# Patient Record
Sex: Female | Born: 1985 | Race: White | Hispanic: Yes | Marital: Married | Smoking: Former smoker
Health system: Southern US, Community
[De-identification: ages and names within clinical notes are randomized; demographics above are authoritative.]

## PROBLEM LIST (undated history)

## (undated) ENCOUNTER — Inpatient Hospital Stay (HOSPITAL_COMMUNITY): Payer: Self-pay

## (undated) DIAGNOSIS — F32A Depression, unspecified: Secondary | ICD-10-CM

## (undated) DIAGNOSIS — Z789 Other specified health status: Secondary | ICD-10-CM

## (undated) DIAGNOSIS — F329 Major depressive disorder, single episode, unspecified: Secondary | ICD-10-CM

## (undated) HISTORY — PX: NO PAST SURGERIES: SHX2092

---
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
## 2009-12-13 ENCOUNTER — Inpatient Hospital Stay (HOSPITAL_COMMUNITY): Admission: AD | Admit: 2009-12-13 | Discharge: 2009-12-14 | Payer: Self-pay | Admitting: Obstetrics and Gynecology

## 2010-02-02 ENCOUNTER — Inpatient Hospital Stay (HOSPITAL_COMMUNITY): Admission: AD | Admit: 2010-02-02 | Discharge: 2010-02-05 | Payer: Self-pay | Admitting: Obstetrics and Gynecology

## 2010-08-16 NOTE — L&D Delivery Note (Signed)
Delivery Note At 8:28 AM a viable female was delivered via Vaginal, Spontaneous Delivery (Presentation: Left Occiput Anterior).   Patient delivered in waterbirth tub.   APGAR: 8, 9; weight 8 lb 1.4 oz (3668 g).   Placenta status: Intact, Spontaneous.  Cord: 3 vessels with the following complications: Cord around neck/shourders, untangled at delivery.    Cord pH: NA  Anesthesia: None  Episiotomy: None Lacerations: 1st degree Suture Repair: no repair required Est. Blood Loss (mL): 400  Skin to skin with mom in tub, then with dad when patient transferring from tub to bed for perineal examination.  Mom to postpartum.  Baby to nursery-stable.  Karyme Mcconathy L 03/17/2011, 9:52 AM

## 2010-08-28 LAB — SYPHILIS: RPR W/REFLEX TO RPR TITER AND TREPONEMAL ANTIBODIES, TRADITIONAL SCREENING AND DIAGNOSIS ALGORITHM: RPR: NONREACTIVE

## 2010-08-28 LAB — TYPE AND SCREEN: Antibody Screen: NEGATIVE

## 2010-08-28 LAB — ABO/RH: RH Type: POSITIVE

## 2010-08-28 LAB — HEPATITIS B SURFACE ANTIGEN: Hepatitis B Surface Ag: NEGATIVE

## 2010-09-28 ENCOUNTER — Inpatient Hospital Stay (HOSPITAL_COMMUNITY)
Admission: AD | Admit: 2010-09-28 | Discharge: 2010-09-28 | Disposition: A | Discharge status: Home / Self Care | Payer: Medicaid Other | Source: Ambulatory Visit | Attending: Obstetrics and Gynecology | Admitting: Obstetrics and Gynecology

## 2010-09-28 DIAGNOSIS — K5900 Constipation, unspecified: Secondary | ICD-10-CM | POA: Insufficient documentation

## 2010-09-28 DIAGNOSIS — R109 Unspecified abdominal pain: Secondary | ICD-10-CM | POA: Insufficient documentation

## 2010-09-28 DIAGNOSIS — O99891 Other specified diseases and conditions complicating pregnancy: Secondary | ICD-10-CM | POA: Insufficient documentation

## 2010-11-01 LAB — CBC
HCT: 40.1 % (ref 36.0–46.0)
Hemoglobin: 13.6 g/dL (ref 12.0–15.0)
MCHC: 33.9 g/dL (ref 30.0–36.0)
RBC: 3.51 MIL/uL — ABNORMAL LOW (ref 3.87–5.11)
RBC: 4.27 MIL/uL (ref 3.87–5.11)
RDW: 14.1 % (ref 11.5–15.5)
RDW: 14.7 % (ref 11.5–15.5)
WBC: 11.7 10*3/uL — ABNORMAL HIGH (ref 4.0–10.5)

## 2010-11-01 LAB — RPR: RPR Ser Ql: NONREACTIVE

## 2010-11-03 LAB — URINALYSIS, ROUTINE W REFLEX MICROSCOPIC
Bilirubin Urine: NEGATIVE
Ketones, ur: NEGATIVE mg/dL
Protein, ur: NEGATIVE mg/dL
Specific Gravity, Urine: 1.02 (ref 1.005–1.030)
Urobilinogen, UA: 0.2 mg/dL (ref 0.0–1.0)
pH: 6.5 (ref 5.0–8.0)

## 2010-11-03 LAB — WET PREP, GENITAL
Trich, Wet Prep: NONE SEEN
Yeast Wet Prep HPF POC: NONE SEEN

## 2010-11-03 LAB — GC/CHLAMYDIA PROBE AMP, GENITAL
Chlamydia, DNA Probe: NEGATIVE
GC Probe Amp, Genital: NEGATIVE

## 2011-03-17 ENCOUNTER — Encounter (HOSPITAL_COMMUNITY): Payer: Self-pay | Admitting: *Deleted

## 2011-03-17 ENCOUNTER — Inpatient Hospital Stay (HOSPITAL_COMMUNITY)
Admission: AD | Admit: 2011-03-17 | Discharge: 2011-03-17 | Disposition: A | Discharge status: Home / Self Care | Payer: Medicaid Other | Source: Ambulatory Visit | Attending: Obstetrics and Gynecology | Admitting: Obstetrics and Gynecology

## 2011-03-17 ENCOUNTER — Inpatient Hospital Stay (HOSPITAL_COMMUNITY)
Admission: AD | Admit: 2011-03-17 | Discharge: 2011-03-19 | DRG: 775 | Disposition: A | Discharge status: Home / Self Care | Payer: Medicaid Other | Source: Ambulatory Visit | Attending: Obstetrics and Gynecology | Admitting: Obstetrics and Gynecology

## 2011-03-17 DIAGNOSIS — Z3490 Encounter for supervision of normal pregnancy, unspecified, unspecified trimester: Secondary | ICD-10-CM

## 2011-03-17 DIAGNOSIS — O479 False labor, unspecified: Secondary | ICD-10-CM | POA: Insufficient documentation

## 2011-03-17 HISTORY — DX: Other specified health status: Z78.9

## 2011-03-17 MED ORDER — ONDANSETRON HCL 4 MG/2ML IJ SOLN: 4.0000 mg | INTRAMUSCULAR | Status: DC | PRN

## 2011-03-17 MED ORDER — PRENATAL PLUS 27-1 MG PO TABS
1.0000 | ORAL_TABLET | Freq: Every day | ORAL | Status: DC
  Administered 2011-03-18 – 2011-03-19 (×2): 1 via ORAL
  Filled 2011-03-17 (×2): qty 1

## 2011-03-17 MED ORDER — MAGNESIUM HYDROXIDE 400 MG/5ML PO SUSP: 30.0000 mL | ORAL | Status: DC | PRN

## 2011-03-17 MED ORDER — FLEET ENEMA 7-19 GM/118ML RE ENEM: 1.0000 | ENEMA | RECTAL | Status: DC | PRN

## 2011-03-17 MED ORDER — IBUPROFEN 600 MG PO TABS: 600.0000 mg | ORAL_TABLET | Freq: Four times a day (QID) | ORAL | Status: DC | PRN

## 2011-03-17 MED ORDER — BENZOCAINE-MENTHOL 20-0.5 % EX AERO: 1.0000 "application " | INHALATION_SPRAY | CUTANEOUS | Status: DC | PRN

## 2011-03-17 MED ORDER — OXYCODONE-ACETAMINOPHEN 5-325 MG PO TABS
2.0000 | ORAL_TABLET | ORAL | Status: DC | PRN
  Administered 2011-03-17: 2 via ORAL

## 2011-03-17 MED ORDER — IBUPROFEN 600 MG PO TABS
600.0000 mg | ORAL_TABLET | Freq: Four times a day (QID) | ORAL | Status: DC
  Administered 2011-03-17 – 2011-03-19 (×7): 600 mg via ORAL
  Filled 2011-03-17 (×7): qty 1

## 2011-03-17 MED ORDER — LIDOCAINE HCL (PF) 1 % IJ SOLN: 30.0000 mL | INTRAMUSCULAR | Status: DC | PRN

## 2011-03-17 MED ORDER — LANOLIN HYDROUS EX OINT: TOPICAL_OINTMENT | CUTANEOUS | Status: DC | PRN

## 2011-03-17 MED ORDER — ONDANSETRON HCL 4 MG/2ML IJ SOLN: 4.0000 mg | Freq: Four times a day (QID) | INTRAMUSCULAR | Status: DC | PRN

## 2011-03-17 MED ORDER — ZOLPIDEM TARTRATE 5 MG PO TABS: 5.0000 mg | ORAL_TABLET | Freq: Every evening | ORAL | Status: DC | PRN

## 2011-03-17 MED ORDER — SIMETHICONE 80 MG PO CHEW: 80.0000 mg | CHEWABLE_TABLET | ORAL | Status: DC | PRN

## 2011-03-17 MED ORDER — ACETAMINOPHEN 325 MG PO TABS: 650.0000 mg | ORAL_TABLET | ORAL | Status: DC | PRN

## 2011-03-17 MED ORDER — LACTATED RINGERS IV SOLN: INTRAVENOUS | Status: DC

## 2011-03-17 MED ORDER — LACTATED RINGERS IV SOLN: 500.0000 mL | INTRAVENOUS | Status: DC | PRN

## 2011-03-17 MED ORDER — HYDROXYZINE PAMOATE 50 MG PO CAPS
50.0000 mg | ORAL_CAPSULE | Freq: Once | ORAL | Status: AC
  Administered 2011-03-17: 50 mg via ORAL
  Filled 2011-03-17: qty 1

## 2011-03-17 MED ORDER — MEASLES, MUMPS & RUBELLA VAC ~~LOC~~ INJ: 0.5000 mL | INJECTION | Freq: Once | SUBCUTANEOUS | Status: DC

## 2011-03-17 MED ORDER — SENNOSIDES-DOCUSATE SODIUM 8.6-50 MG PO TABS
1.0000 | ORAL_TABLET | Freq: Every day | ORAL | Status: DC
  Administered 2011-03-17: 1 via ORAL
  Administered 2011-03-18: 2 via ORAL

## 2011-03-17 MED ORDER — CITRIC ACID-SODIUM CITRATE 334-500 MG/5ML PO SOLN: 30.0000 mL | ORAL | Status: DC | PRN

## 2011-03-17 MED ORDER — ONDANSETRON HCL 4 MG PO TABS: 4.0000 mg | ORAL_TABLET | ORAL | Status: DC | PRN

## 2011-03-17 MED ORDER — TETANUS-DIPHTH-ACELL PERTUSSIS 5-2.5-18.5 LF-MCG/0.5 IM SUSP
0.5000 mL | Freq: Once | INTRAMUSCULAR | Status: AC
  Administered 2011-03-18: 0.5 mL via INTRAMUSCULAR

## 2011-03-17 MED ORDER — DIPHENHYDRAMINE HCL 25 MG PO CAPS: 25.0000 mg | ORAL_CAPSULE | Freq: Four times a day (QID) | ORAL | Status: DC | PRN

## 2011-03-17 MED ORDER — OXYTOCIN 10 UNIT/ML IJ SOLN
10.0000 [IU] | Freq: Once | INTRAMUSCULAR | Status: DC
  Administered 2011-03-17: 20 [IU] via INTRAMUSCULAR

## 2011-03-17 MED ORDER — IBUPROFEN 600 MG PO TABS
600.0000 mg | ORAL_TABLET | Freq: Four times a day (QID) | ORAL | Status: DC | PRN
  Administered 2011-03-17: 600 mg via ORAL

## 2011-03-17 MED ORDER — OXYCODONE-ACETAMINOPHEN 5-325 MG PO TABS: 2.0000 | ORAL_TABLET | ORAL | Status: DC | PRN

## 2011-03-17 MED ORDER — WITCH HAZEL-GLYCERIN EX PADS: MEDICATED_PAD | CUTANEOUS | Status: DC | PRN

## 2011-03-17 MED ORDER — OXYTOCIN 20 UNITS IN LACTATED RINGERS INFUSION - SIMPLE: 125.0000 mL/h | Freq: Once | INTRAVENOUS | Status: DC

## 2011-03-17 MED ORDER — OXYCODONE-ACETAMINOPHEN 5-325 MG PO TABS
1.0000 | ORAL_TABLET | ORAL | Status: DC | PRN
  Administered 2011-03-17 (×3): 1 via ORAL
  Filled 2011-03-17 (×2): qty 1

## 2011-03-17 MED ORDER — OXYTOCIN 20 UNITS IN LACTATED RINGERS INFUSION - SIMPLE: 125.0000 mL/h | INTRAVENOUS | Status: DC | PRN

## 2011-03-17 NOTE — Progress Notes (Signed)
Patient ready for transfer. VSS.  Bleeding mininal. Infant has been skin-to-skin and has breastfed.  Nigel Bridgeman, CNM

## 2011-03-17 NOTE — Progress Notes (Signed)
Feeling some pressure, in tub at 7:48a.  Husband and female support person at bedside.    Objective: VS stable. FHR reassuring by auscultation.    UC:   regular, every 4 minutes SVE:   Dilation: 10 Effacement (%): 100 Station: +1 Exam by:: Nyasiah Moffet, CNM  Labs: Lab Results  Component Value Date   WBC 16.0* 02/04/2010   HGB 11.4* 02/04/2010   HCT 33.5* 02/04/2010   MCV 95.3 02/04/2010   PLT 146* 02/04/2010    Assessment / Plan: Spontaneous labor, progressing normally Initiate active pushing in tub.  Anticipated MOD:  NSVD  Bridgett Hattabaugh L 03/17/2011, 9:55 AM

## 2011-03-17 NOTE — Plan of Care (Signed)
Pt desires to go home,  FHR reactive Cat 1  Will D/C home with 50mg  Vistaril PO

## 2011-03-17 NOTE — Progress Notes (Signed)
Pt  Reports ROM at 0430, contractions worsening

## 2011-03-17 NOTE — H&P (Signed)
Danielle Cherry is a 25 y.o. female presenting for increased contractions and ROM, clear fluid.  Seen in [facility]U earlier tonight without cervical change, elected to go home. Maternal Medical History:  Reason for admission: Reason for admission: rupture of membranes and contractions.  Contractions: Onset was less than 1 hour ago.   Frequency: regular.   Duration is approximately 60 seconds.   Perceived severity is moderate.    Fetal activity: Perceived fetal activity is normal.   Last perceived fetal movement was within the past hour.      OB History    Grav Para Term Preterm Abortions TAB SAB Ect Mult Living   2 1 1       1      Past Medical History  Diagnosis Date  . No pertinent past medical history   Pregnancy remarkable for: 1. Hx ovarian cysts 2.  Short stature 3. Closely -spaced pregnancy 4. GBS negative 5.  Plans waterbirth   History reviewed. No pertinent past surgical history.  History of ovarian cysts, anemia in past. Family History: FH cardiac disease, adult onset diabetes Social History:  reports that she has never smoked. She has never used smokeless tobacco. She reports that she drinks about .6 ounces of alcohol per week. She reports that she does not use illicit drugs.  FOB involved, supportive,   Patient unemployed, FOB in [employer].  No etoh during pregnancy.    Review of Systems  Constitutional: Negative.   HENT: Negative.   Eyes: Negative.   Respiratory: Negative.   Gastrointestinal: Negative.   Genitourinary: Negative.   Musculoskeletal: Negative.   Skin: Negative.   Neurological: Negative.   Endo/Heme/Allergies: Negative.   Psychiatric/Behavioral: Negative.     Cervix on admission 5 cm, with leaking of clear fluid, vtx, -1, 100% effaced. FHR reassuring on initial tracing. Blood pressure 115/64, pulse 100, temperature 98.2 F (36.8 C), temperature source Oral, resp. rate 18, height 4\' 11"  (1.499 m), weight 82.101 kg (181 lb), last menstrual period  06/19/2010. Maternal Exam:  Uterine Assessment: Contraction strength is moderate.  Contraction duration is 60 seconds. Contraction frequency is regular.   Abdomen: Patient reports no abdominal tenderness. Fundal height is 39 weeks.   Estimated fetal weight is 8 lbs.   Fetal presentation: vertex  Introitus: Normal vulva. Normal vagina.  Vaginal discharge: clear fluid.  Ferning test: not done.  Nitrazine test: not done. Amniotic fluid character: clear.  Pelvis: adequate for delivery.      Physical Exam  Constitutional: She appears well-developed and well-nourished.  HENT:  Head: Normocephalic.  Eyes: Pupils are equal, round, and reactive to light.  Neck: Normal range of motion.  Cardiovascular: Normal rate.   Respiratory: Effort normal.  GI: Soft.  Genitourinary: Vagina normal. Vaginal discharge: clear fluid.  Musculoskeletal: Normal range of motion.  Neurological: She is alert.  Skin: Skin is warm and dry.  Psychiatric: She has a normal mood and affect.    Prenatal labs: ABO, Rh:  A+ Antibody: Negative (01/13 0000) Rubella:  Immune RPR: Nonreactive (01/13 0000)  HBsAg: Negative (01/13 0000)  HIV: Non-reactive (01/13 0000)  GBS: Negative (07/02 0000)   Assessment/Plan: IUP at 39 2/7 weeks SROM, active labor  GBS negative Desires waterbirth.  Plan: Admit to [facility] Routine CNM orders Waterbirth care Intermittent monitoring.  Danielle Cherry L 03/17/2011, 9:23 AM

## 2011-03-17 NOTE — Progress Notes (Signed)
Pt presents for a labor check-u/c's starteed at 2300

## 2011-03-17 NOTE — Progress Notes (Signed)
Pt continues to be in the tub for delivery.

## 2011-03-17 NOTE — ED Provider Notes (Signed)
Danielle Cherry is a 25 y.o. female presenting for on set of ctx around 2300, she says they come and go sometimes for 20 minutes then come back and get regular again. No VB, LOF, +FM.  Maternal Medical History:  Reason for admission: Reason for admission: contractions.  Contractions: Onset was 3-5 hours ago.   Frequency: irregular.   Perceived severity is mild.    Fetal activity: Perceived fetal activity is normal.   Last perceived fetal movement was within the past hour.    Prenatal complications: no prenatal complications   OB History    Grav Para Term Preterm Abortions TAB SAB Ect Mult Living   2 1 1       1      Past Medical History  Diagnosis Date  . No pertinent past medical history    No past surgical history on file. Family History: family history is not on file. Social History:  reports that she has never smoked. She has never used smokeless tobacco. She reports that she drinks about .6 ounces of alcohol per week. She reports that she does not use illicit drugs.  Review of Systems  All other systems reviewed and are negative.    Dilation: 4 Effacement (%): 100 Station: -1 Exam by:: S.Jamoni Hewes,CNM Blood pressure 125/68, pulse 87, temperature 97.7 F (36.5 C), temperature source Oral, resp. rate 20, height 4\' 11"  (1.499 m), weight 82.101 kg (181 lb), last menstrual period 06/19/2010. Maternal Exam:  Uterine Assessment: Contraction strength is mild.  Contraction frequency is regular.   Abdomen: Patient reports no abdominal tenderness. Estimated fetal weight is 7#8oz.   Fetal presentation: vertex  Introitus: Normal vulva. Normal vagina.    Fetal Exam Fetal Monitor Review: Mode: ultrasound.   Baseline rate: 140.  Variability: moderate (6-25 bpm).   Pattern: accelerations present and no decelerations.    Fetal State Assessment: Category I - tracings are normal.     Physical Exam  Nursing note and vitals reviewed. Constitutional: She is oriented to person, place,  and time. She appears well-developed and well-nourished.  Cardiovascular: Normal rate and regular rhythm.   Respiratory: Effort normal and breath sounds normal.  GI: Soft.  Genitourinary: Vagina normal and uterus normal.  Neurological: She is alert and oriented to person, place, and time.  Skin: Skin is warm and dry.    Prenatal labs: ABO, Rh:   Antibody:   Rubella:   RPR:    HBsAg:    HIV:    GBS:     Assessment/Plan: G2P1 at 38.3 wks,  May be in early labor No cervical change from office visit GBS negative FHR reassuring Pt desires to go home and with something for sleep   Shaena Parkerson M 03/17/2011, 2:04 AM

## 2011-03-18 ENCOUNTER — Encounter (HOSPITAL_COMMUNITY): Payer: Self-pay | Admitting: Obstetrics and Gynecology

## 2011-03-18 LAB — CBC
HCT: 34.3 % — ABNORMAL LOW (ref 36.0–46.0)
Hemoglobin: 10.9 g/dL — ABNORMAL LOW (ref 12.0–15.0)
RBC: 3.88 MIL/uL (ref 3.87–5.11)
WBC: 13.9 10*3/uL — ABNORMAL HIGH (ref 4.0–10.5)

## 2011-03-18 NOTE — Progress Notes (Addendum)
Post Partum Day 1 Subjective: no complaints and up ad lib.  Breastfeeding going well.  Undecided regarding contraception.  Using Motrin for pain.  Objective: Blood pressure 114/77, pulse 93, temperature 98.2 F (36.8 C), temperature source Oral, resp. rate 18, height 4\' 11"  (1.499 m), weight 82.101 kg (181 lb), last menstrual period 06/19/2010.  Physical Exam:  General: alert Lochia: appropriate Uterine Fundus: firm Incision: No lacerations DVT Evaluation: No evidence of DVT seen on physical exam.   Basename 03/18/11 0520  HGB 10.9*  HCT 34.3*    Assessment/Plan: Plan for discharge tomorrow Continue current care.    LOS: 1 day   LATHAM,VICKI L 03/18/2011, 12:12 PM    Agree with above Purcell Nails, MD

## 2011-03-18 NOTE — Progress Notes (Signed)
Mom states switched to BO/Formula.  Said she was bleeding and baby was spitting up mucus.  Explained that bleeding was d/t latch and offered assistance.  Visitors in room.  Mom said she will call for assistance.  Educated on benefits of BF v/s BO and explained BF infants spit up less than Formula infants.  Educated on early feeding cues.  Mom will call for assistance

## 2011-03-18 NOTE — Progress Notes (Signed)
UR Chart review completed.  

## 2011-03-19 MED ORDER — IBUPROFEN 600 MG PO TABS: 600.0000 mg | ORAL_TABLET | Freq: Four times a day (QID) | ORAL | Status: AC

## 2011-03-19 NOTE — Progress Notes (Signed)
Post Partum Day 2 Subjective: no complaints.  Ready for discharge.  Breastfeeding.  Plans Nexplanon.  Objective: Blood pressure 114/73, pulse 78, temperature 98 F (36.7 C), temperature source Oral, resp. rate 18, height 4\' 11"  (1.499 m), weight 82.101 kg (181 lb), last menstrual period 06/19/2010, SpO2 99.00%, unknown if currently breastfeeding.  Physical Exam:  General: alert Lochia: appropriate Uterine Fundus: firm Incision: None DVT Evaluation: No evidence of DVT seen on physical exam.   Basename 03/18/11 0520  HGB 10.9*  HCT 34.3*    Assessment/Plan: Discharge home Breastfeeding Rx Motrin Plan follow-up in 5 weeks for pp exam, then at 6 weeks for Nexplanon.   LOS: 2 days   Orval Dortch L 03/19/2011, 10:53 AM

## 2011-03-19 NOTE — Discharge Summary (Deleted)
Obstetric Discharge Summary Reason for Admission: onset of labor Prenatal Procedures: ultrasound Intrapartum Procedures: spontaneous vaginal delivery, waterbirth Postpartum Procedures: none Complications-Operative and Postpartum: none Hemoglobin  Date Value Range Status  03/18/2011 10.9* 12.0-15.0 (g/dL) Final     HCT  Date Value Range Status  03/18/2011 34.3* 36.0-46.0 (%) Final    Discharge Diagnoses: Term Pregnancy-delivered  Discharge Information: Date: 03/19/2011 Activity: unrestricted Diet: routine Medications: Ibuprophen Condition: stable Instructions: refer to practice specific booklet Discharge to: home Contraception:  Plans Nexplanon Follow-Up:  5 weeks at [facility]   Newborn Data: Live born female  Birth Weight: 8 lb 1.4 oz (3668 g) APGAR: 8, 9  Home with mother.  Tennis Mckinnon L 03/19/2011, 10:49 AM

## 2011-03-19 NOTE — Discharge Summary (Signed)
Obstetric Discharge Summary Reason for Admission: onset of labor Prenatal Procedures: ultrasound Intrapartum Procedures: spontaneous vaginal delivery in birthtub Postpartum Procedures: none Complications-Operative and Postpartum: none Hemoglobin  Date Value Range Status  03/18/2011 10.9* 12.0-15.0 (g/dL) Final     HCT  Date Value Range Status  03/18/2011 34.3* 36.0-46.0 (%) Final    Discharge Diagnoses: Term Pregnancy-delivered  Discharge Information: Date: 03/19/2011 Activity: Per booklet Diet: routine Medications: Ibuprophen Condition: stable Instructions: refer to practice specific booklet Discharge to: home   Newborn Data: Live born female  Birth Weight: 8 lb 1.4 oz (3668 g) APGAR: 8, 9  Home with mother.  Tawona Filsinger L 03/19/2011, 10:56 AM

## 2011-03-26 NOTE — H&P (Signed)
NAME:  Danielle Cherry, Danielle Cherry                      ACCOUNT NO.:  MEDICAL RECORD NO.:  [id number]  LOCATION:                                 FACILITY:  PHYSICIAN:  Brynlei Klausner L. Gyasi Hazzard, C.N.M.DATE OF BIRTH:  09/25/1985  DATE OF ADMISSION: DATE OF DISCHARGE:                             HISTORY & PHYSICAL   DIAGNOSES: 1. Intrauterine pregnancy at term. 2. Desires water birth.  POSTDELIVERY DIAGNOSIS:  Spontaneous vaginal delivery at term.  First degree laceration.  PROCEDURES: 1. Normal spontaneous vaginal birth. 2. First degree laceration, no repair required.  ANESTHESIA AND ANALGESIA:  None.  FINDINGS:  The patient progressed quickly to completely dilated and pushed approximately 15 minutes with delivery of a viable female by the name of Genivieve at 8:28 a.m. on March 17, 2011.  Apgars were 8 and 9. Infant weight was 8 pounds, 1.4 ounces.  Infant was placed on mom skin to skin.  The patient was removed from the tub prior to delivery of placenta.  There was a loose nuchal cord noted at the time of delivery. This was reduced over the vertex, at the time of delivery.  Estimated blood loss was approximately 400 mL.  First degree perineal laceration was noted but did not require repair.  The patient was planning to breast feed.  The infant remained with mother but was appropriate for the stable term nursery.  The patient in recovery phase in good condition.     Renaldo Reel Emilee Hero, C.N.M.     VLL/MEDQ  D:  03/25/2011  T:  03/25/2011  Job:  [id number]

## 2011-04-12 ENCOUNTER — Emergency Department (HOSPITAL_COMMUNITY): Payer: Medicaid Other

## 2011-04-12 ENCOUNTER — Emergency Department (HOSPITAL_COMMUNITY)
Admission: EM | Admit: 2011-04-12 | Discharge: 2011-04-12 | Disposition: A | Discharge status: Home / Self Care | Payer: Medicaid Other | Attending: Emergency Medicine | Admitting: Emergency Medicine

## 2011-04-12 DIAGNOSIS — M549 Dorsalgia, unspecified: Secondary | ICD-10-CM | POA: Insufficient documentation

## 2011-04-12 DIAGNOSIS — M25559 Pain in unspecified hip: Secondary | ICD-10-CM | POA: Insufficient documentation

## 2011-04-12 DIAGNOSIS — R509 Fever, unspecified: Secondary | ICD-10-CM | POA: Insufficient documentation

## 2011-04-12 DIAGNOSIS — N949 Unspecified condition associated with female genital organs and menstrual cycle: Secondary | ICD-10-CM | POA: Insufficient documentation

## 2011-04-12 DIAGNOSIS — R1032 Left lower quadrant pain: Secondary | ICD-10-CM | POA: Insufficient documentation

## 2011-04-12 DIAGNOSIS — N809 Endometriosis, unspecified: Secondary | ICD-10-CM | POA: Insufficient documentation

## 2011-04-12 LAB — URINALYSIS, ROUTINE W REFLEX MICROSCOPIC
Glucose, UA: NEGATIVE mg/dL
Leukocytes, UA: NEGATIVE
Nitrite: NEGATIVE
Specific Gravity, Urine: 1.025 (ref 1.005–1.030)
pH: 6 (ref 5.0–8.0)

## 2011-04-12 LAB — DIFFERENTIAL
Basophils Absolute: 0 10*3/uL (ref 0.0–0.1)
Lymphocytes Relative: 38 % (ref 12–46)
Lymphs Abs: 1.8 10*3/uL (ref 0.7–4.0)
Neutrophils Relative %: 50 % (ref 43–77)

## 2011-04-12 LAB — BASIC METABOLIC PANEL
BUN: 11 mg/dL (ref 6–23)
Chloride: 104 mEq/L (ref 96–112)
Creatinine, Ser: 0.82 mg/dL (ref 0.50–1.10)
GFR calc Af Amer: >60 mL/min (ref >60)
GFR calc non Af Amer: >60 mL/min (ref >60)
Potassium: 3.7 mEq/L (ref 3.5–5.1)

## 2011-04-12 LAB — CBC
HCT: 44.4 % (ref 36.0–46.0)
MCV: 86.2 fL (ref 78.0–100.0)
Platelets: 195 10*3/uL (ref 150–400)
RBC: 5.15 MIL/uL — ABNORMAL HIGH (ref 3.87–5.11)
WBC: 4.7 10*3/uL (ref 4.0–10.5)

## 2011-04-12 LAB — WET PREP, GENITAL: Trich, Wet Prep: NONE SEEN

## 2011-04-12 IMAGING — CR DG HIP (WITH OR WITHOUT PELVIS) 2-3V*L*
3 series · 3 of 3 positions shown · non-contrast
Comparison: None.

CLINICAL DATA: Left hip pain for many years worse since childbirth
1 month ago.

LEFT HIP - COMPLETE 2+ VIEW

[t pelvis a.p.]
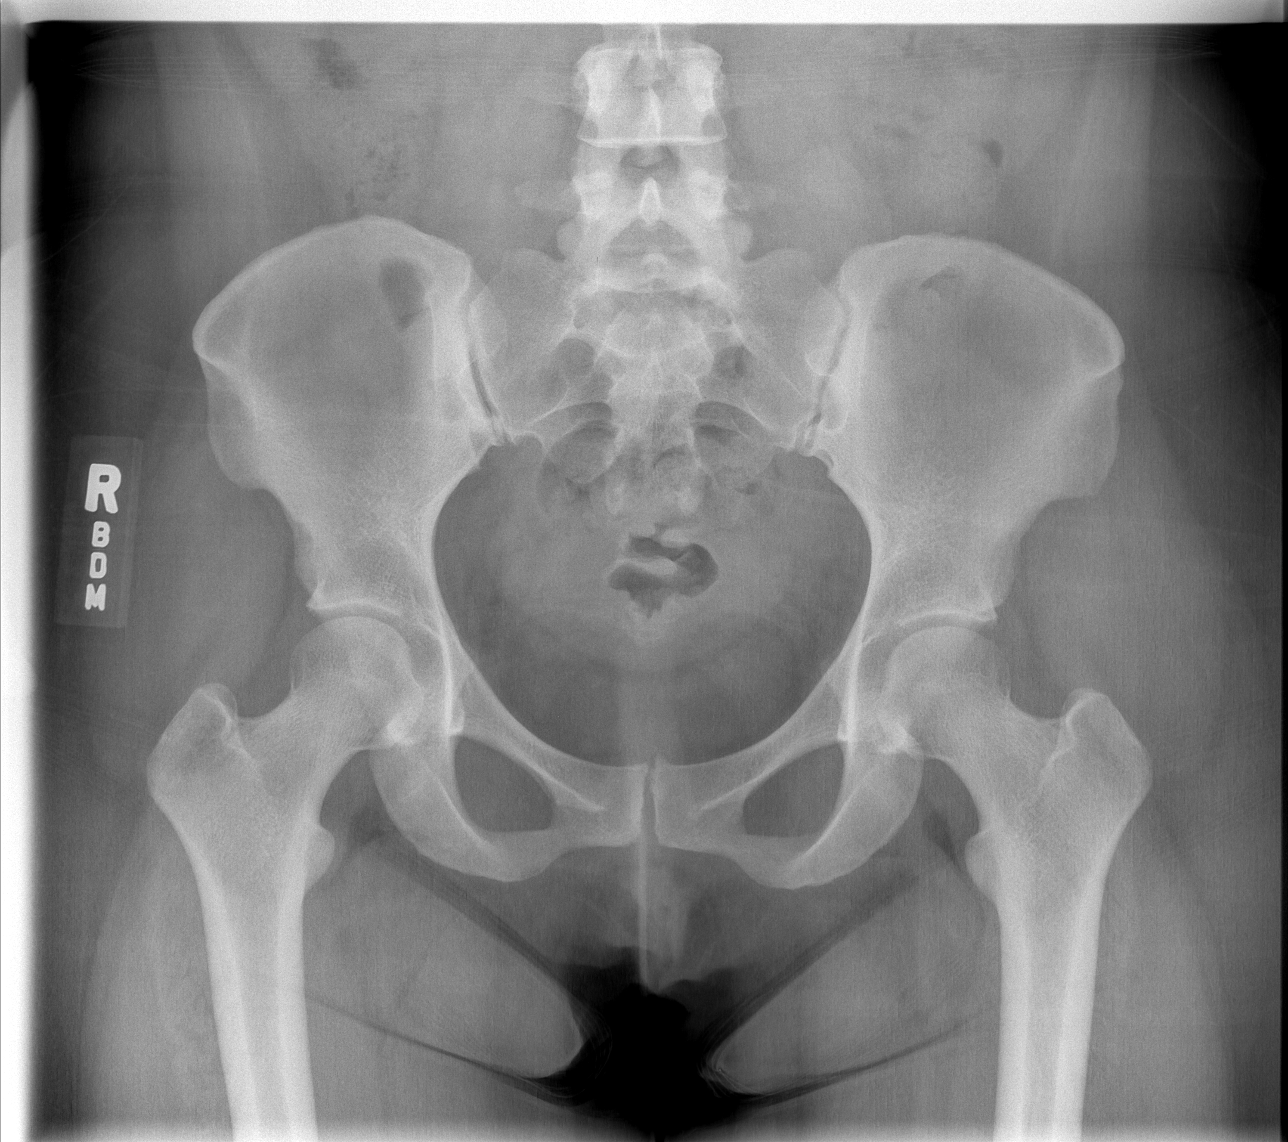

[t hip ap left]
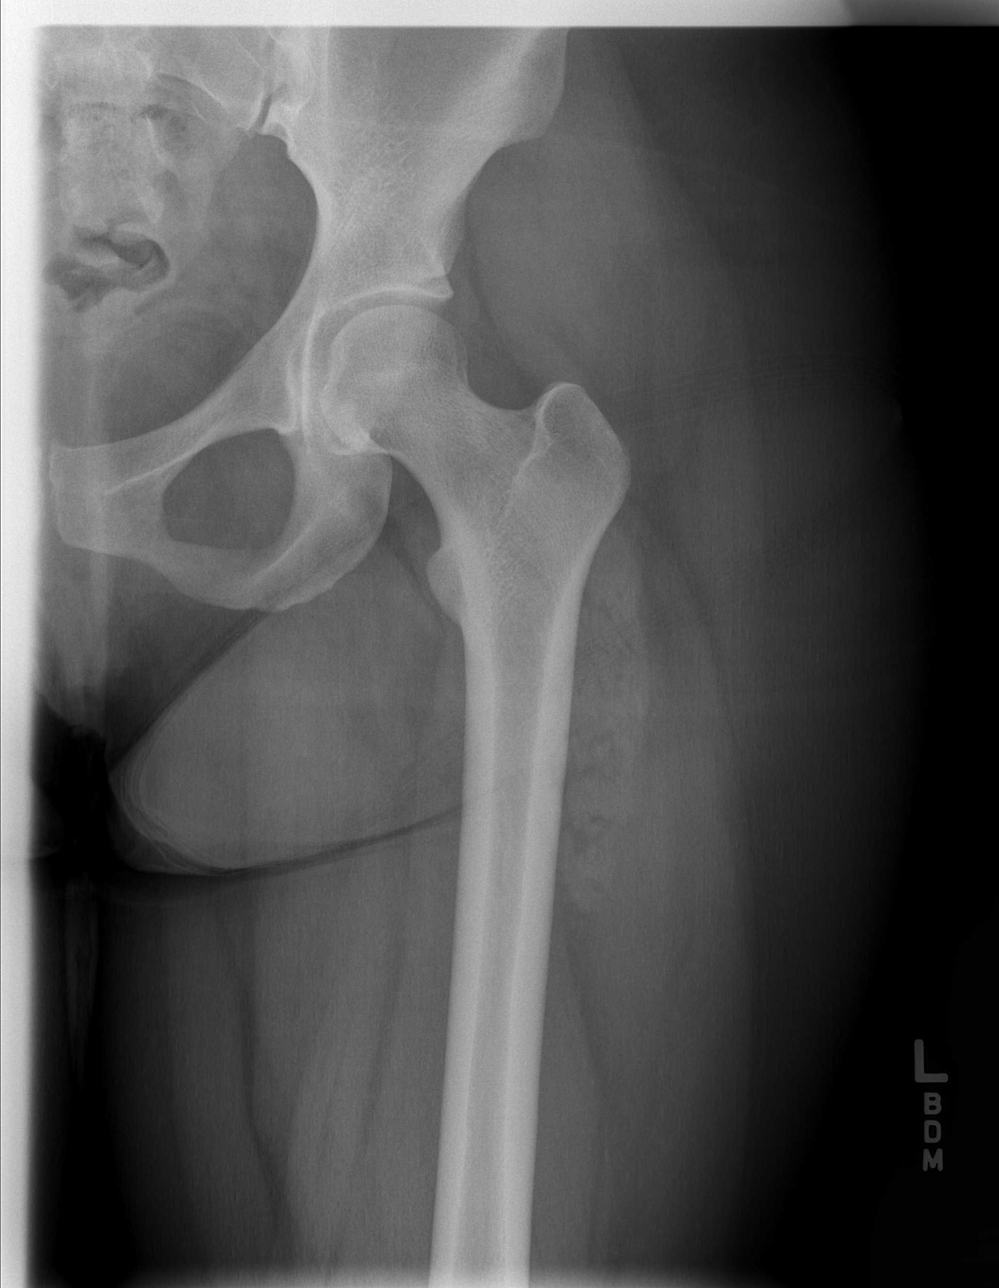

[t hip frog leg left]
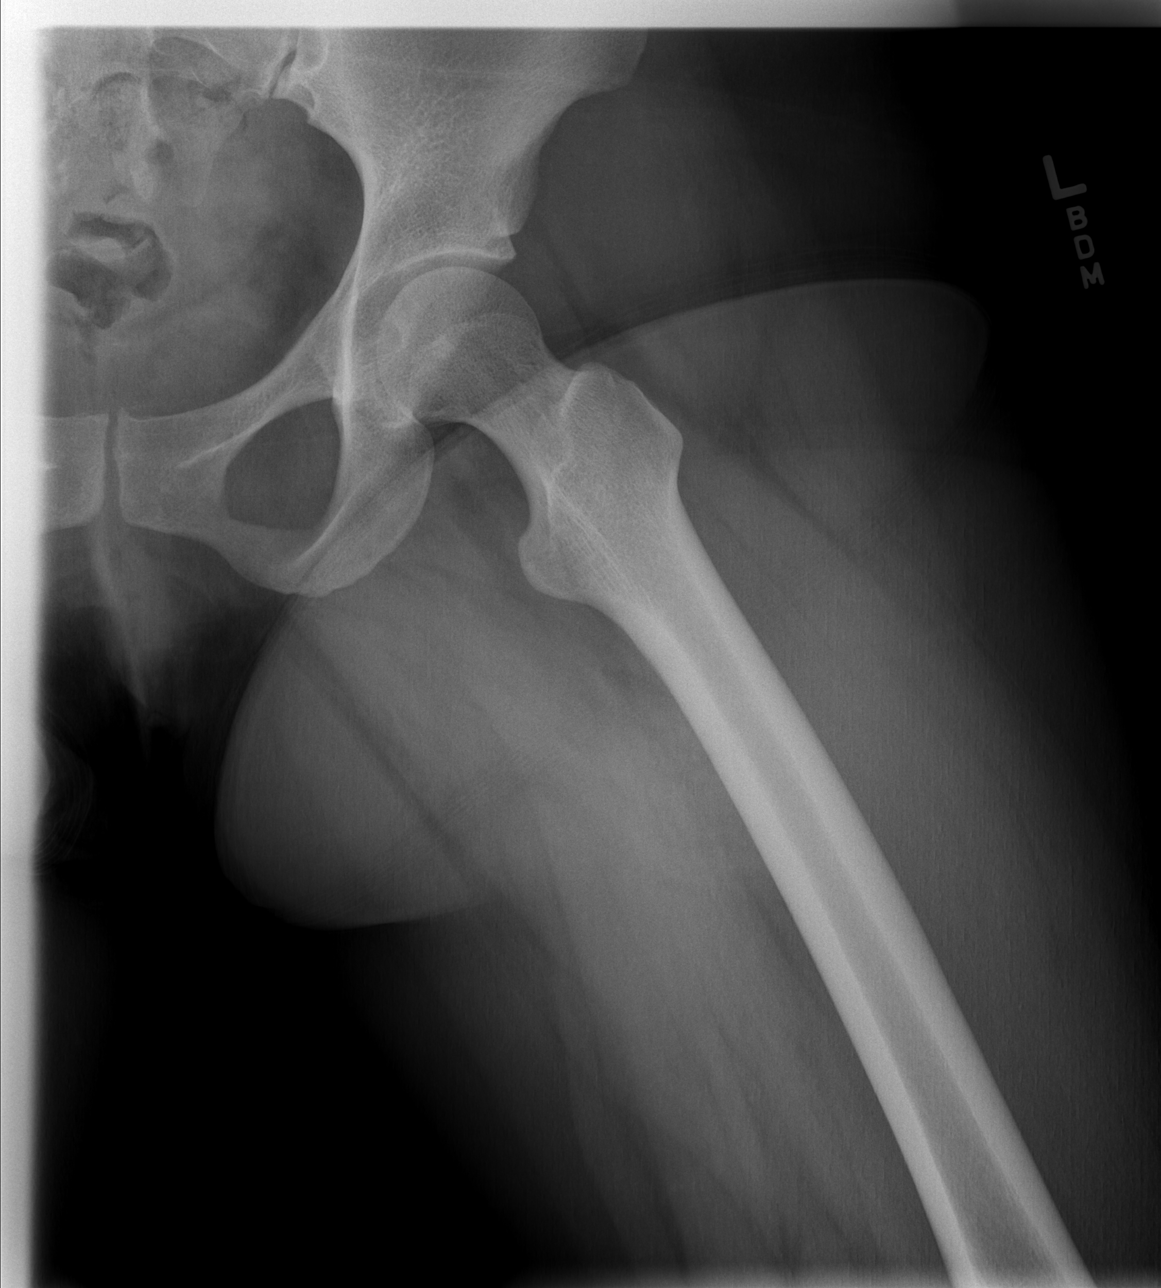

[3 of 3 positions shown; findings below may reference images not displayed]

FINDINGS: No significant hip joint degenerative changes or plain
film evidence of avascular necrosis.

Mild degenerative changes pubic symphysis.
IMPRESSION: No evidence of significant hip joint degenerative changes or plain
film evidence of avascular necrosis.

## 2011-04-13 LAB — GC/CHLAMYDIA PROBE AMP, GENITAL: GC Probe Amp, Genital: NEGATIVE

## 2012-10-12 ENCOUNTER — Encounter (HOSPITAL_COMMUNITY): Payer: Self-pay | Admitting: Emergency Medicine

## 2012-10-12 ENCOUNTER — Emergency Department (HOSPITAL_COMMUNITY)
Admission: EM | Admit: 2012-10-12 | Discharge: 2012-10-12 | Disposition: A | Discharge status: Home / Self Care | Payer: Self-pay | Attending: Emergency Medicine | Admitting: Emergency Medicine

## 2012-10-12 DIAGNOSIS — K089 Disorder of teeth and supporting structures, unspecified: Secondary | ICD-10-CM | POA: Insufficient documentation

## 2012-10-12 DIAGNOSIS — R110 Nausea: Secondary | ICD-10-CM | POA: Insufficient documentation

## 2012-10-12 DIAGNOSIS — K0889 Other specified disorders of teeth and supporting structures: Secondary | ICD-10-CM

## 2012-10-12 DIAGNOSIS — H9209 Otalgia, unspecified ear: Secondary | ICD-10-CM | POA: Insufficient documentation

## 2012-10-12 DIAGNOSIS — J029 Acute pharyngitis, unspecified: Secondary | ICD-10-CM | POA: Insufficient documentation

## 2012-10-12 MED ORDER — NAPROXEN 250 MG PO TABS
500.0000 mg | ORAL_TABLET | Freq: Once | ORAL | Status: DC
  Filled 2012-10-12: qty 2

## 2012-10-12 MED ORDER — OXYCODONE-ACETAMINOPHEN 5-325 MG PO TABS
1.0000 | ORAL_TABLET | Freq: Once | ORAL | Status: AC
  Administered 2012-10-12: 1 via ORAL
  Filled 2012-10-12: qty 1

## 2012-10-12 MED ORDER — IBUPROFEN 400 MG PO TABS
800.0000 mg | ORAL_TABLET | Freq: Once | ORAL | Status: AC
  Administered 2012-10-12: 800 mg via ORAL
  Filled 2012-10-12: qty 2

## 2012-10-12 MED ORDER — OXYCODONE-ACETAMINOPHEN 5-325 MG PO TABS: ORAL_TABLET | ORAL | Status: DC

## 2012-10-12 MED ORDER — PENICILLIN V POTASSIUM 500 MG PO TABS: 500.0000 mg | ORAL_TABLET | Freq: Four times a day (QID) | ORAL | Status: AC

## 2012-10-12 NOTE — ED Notes (Signed)
Pt reports developing right ear pain, right facial pain, headache, and throat pain around 3pm this afternoon. Denies fever, nausea, vomiting with ear pain. Denies hard of hearing out of right ear. Throat red and inflamed.

## 2012-10-12 NOTE — ED Notes (Signed)
PT. REPORTS RIGHT LOWER MOLAR PAIN , BILATERAL EAR ACHE AND SLIGHT SORE THROAT ONSET THIS AFTERNOON .

## 2012-10-12 NOTE — ED Provider Notes (Signed)
History  This chart was scribed for non-physician practitioner working with Richardean Canal, MD by Ardeen Jourdain, ED Scribe. This patient was seen in room TR08C/TR08C and the patient's care was started at 2205.  CSN: [id number]  Arrival date & time 10/12/12  2130   None     Chief Complaint  Patient presents with  . Dental Pain  . Otalgia     The history is provided by the patient. No language interpreter was used.    Danielle Cherry is a 27 y.o. female who presents to the Emergency Department complaining of gradually worsening right lower molar pain, bilateral ear ache and sore throat that began 7 hours ago. She has associated nausea and eye soreness. She rates the pain at a 7 out of 10. She states she has had a molar infection before and states the current pain feels similar. She denies any fever and emesis as associated symptoms. She denies taking anything for the pain. She reports she has not had her wisdom teeth removed.  Past Medical History  Diagnosis Date  . No pertinent past medical history     History reviewed. No pertinent past surgical history.  No family history on file.  History  Substance Use Topics  . Smoking status: Never Smoker   . Smokeless tobacco: Never Used  . Alcohol Use: 0.6 oz/week    1 Glasses of wine per week    OB History   Grav Para Term Preterm Abortions TAB SAB Ect Mult Living   2 2 2       2       Review of Systems  Constitutional: Negative for fever.  HENT: Positive for ear pain, sore throat and dental problem.   Respiratory: Negative for shortness of breath.   Cardiovascular: Negative for chest pain.  Gastrointestinal: Positive for nausea. Negative for vomiting, abdominal pain and diarrhea.  All other systems reviewed and are negative.    Allergies  Strawberry  Home Medications   No current outpatient prescriptions on file.  Triage Vitals: BP 122/79  Pulse 80  Temp(Src) 98.8 F (37.1 C) (Oral)  Resp 14  SpO2 99%  Physical  Exam  Nursing note and vitals reviewed. Constitutional: She is oriented to person, place, and time. She appears well-developed and well-nourished. No distress.  HENT:  Head: Normocephalic.  Mouth/Throat: Oropharynx is clear and moist. No oropharyngeal exudate.  Overall good dentition with no obvious dental carries or dental fractures. No soft tissue swelling  Eyes: Conjunctivae and EOM are normal.  Cardiovascular: Normal rate.   Pulmonary/Chest: Effort normal. No stridor. No respiratory distress.  Musculoskeletal: Normal range of motion.  Neurological: She is alert and oriented to person, place, and time.  Skin: She is not diaphoretic.  Psychiatric: She has a normal mood and affect. Her behavior is normal.    ED Course  Procedures (including critical care time)  DIAGNOSTIC STUDIES: Oxygen Saturation is 99% on room air, normal by my interpretation.    COORDINATION OF CARE:  10:20 PM: Discussed treatment plan which includes pain medication, antibiotics and follow up with a dentist with pt at bedside and pt agreed to plan.     Labs Reviewed - No data to display No results found.   1. Pain, dental       MDM   Dental pain with no signs of infection. I will give prophylactic antibiotics.   Filed Vitals:   10/12/12 2138  BP: 122/79  Pulse: 80  Temp: 98.8 F (37.1 C)  TempSrc: Oral  Resp: 14  SpO2: 99%     Pt verbalized understanding and agrees with care plan. Outpatient follow-up and return precautions given.    Discharge Medication List as of 10/12/2012 10:46 PM    START taking these medications   Details  oxyCODONE-acetaminophen (PERCOCET/ROXICET) 5-325 MG per tablet 1 to 2 tabs PO q6hrs  PRN for pain, Print    penicillin v potassium (VEETID) 500 MG tablet Take 1 tablet (500 mg total) by mouth 4 (four) times daily., Starting 10/12/2012, Last dose on Thu 10/19/12, Print        I personally performed the services described in this documentation, which was  scribed in my presence. The recorded information has been reviewed and is accurate.   Wynetta Emery, PA-C 10/13/12 1003

## 2012-10-14 NOTE — ED Provider Notes (Signed)
Medical screening examination/treatment/procedure(s) were performed by non-physician practitioner and as supervising physician I was immediately available for consultation/collaboration.   Jceon Alverio H Erminia Mcnew, MD 10/14/12 0704 

## 2012-12-30 ENCOUNTER — Emergency Department (HOSPITAL_COMMUNITY)
Admission: EM | Admit: 2012-12-30 | Discharge: 2012-12-31 | Disposition: A | Discharge status: Home / Self Care | Payer: Self-pay | Attending: Emergency Medicine | Admitting: Emergency Medicine

## 2012-12-30 DIAGNOSIS — N390 Urinary tract infection, site not specified: Secondary | ICD-10-CM | POA: Insufficient documentation

## 2012-12-30 DIAGNOSIS — J3489 Other specified disorders of nose and nasal sinuses: Secondary | ICD-10-CM | POA: Insufficient documentation

## 2012-12-30 DIAGNOSIS — Z3202 Encounter for pregnancy test, result negative: Secondary | ICD-10-CM | POA: Insufficient documentation

## 2012-12-30 DIAGNOSIS — R059 Cough, unspecified: Secondary | ICD-10-CM | POA: Insufficient documentation

## 2012-12-30 DIAGNOSIS — R05 Cough: Secondary | ICD-10-CM | POA: Insufficient documentation

## 2012-12-30 DIAGNOSIS — J029 Acute pharyngitis, unspecified: Secondary | ICD-10-CM | POA: Insufficient documentation

## 2012-12-30 DIAGNOSIS — J069 Acute upper respiratory infection, unspecified: Secondary | ICD-10-CM

## 2012-12-30 DIAGNOSIS — Z79899 Other long term (current) drug therapy: Secondary | ICD-10-CM | POA: Insufficient documentation

## 2012-12-30 LAB — CBC WITH DIFFERENTIAL/PLATELET
Basophils Absolute: 0 10*3/uL (ref 0.0–0.1)
Basophils Relative: 0 % (ref 0–1)
Eosinophils Absolute: 0.1 10*3/uL (ref 0.0–0.7)
HCT: 41.2 % (ref 36.0–46.0)
Hemoglobin: 14.2 g/dL (ref 12.0–15.0)
MCH: 31.1 pg (ref 26.0–34.0)
MCHC: 34.5 g/dL (ref 30.0–36.0)
Monocytes Absolute: 0.8 10*3/uL (ref 0.1–1.0)
Monocytes Relative: 7 % (ref 3–12)
Neutrophils Relative %: 71 % (ref 43–77)
RDW: 12.5 % (ref 11.5–15.5)

## 2012-12-30 LAB — COMPREHENSIVE METABOLIC PANEL
Albumin: 4.2 g/dL (ref 3.5–5.2)
BUN: 15 mg/dL (ref 6–23)
Creatinine, Ser: 0.76 mg/dL (ref 0.50–1.10)
Total Protein: 7.9 g/dL (ref 6.0–8.3)

## 2012-12-30 LAB — URINALYSIS, ROUTINE W REFLEX MICROSCOPIC
Glucose, UA: NEGATIVE mg/dL
Leukocytes, UA: NEGATIVE
Specific Gravity, Urine: 1.033 — ABNORMAL HIGH (ref 1.005–1.030)
pH: 5.5 (ref 5.0–8.0)

## 2012-12-30 LAB — POCT PREGNANCY, URINE: Preg Test, Ur: NEGATIVE

## 2012-12-30 NOTE — ED Notes (Signed)
Advised of the wait time 

## 2012-12-30 NOTE — ED Notes (Signed)
Pt states that she has bilateral flank pain, concentrated urine color with strong smell. Also cold/flu like symptoms. Body aches, chills, fever and productive cough.

## 2012-12-31 ENCOUNTER — Emergency Department (HOSPITAL_COMMUNITY): Payer: Self-pay

## 2012-12-31 LAB — RAPID STREP SCREEN (MED CTR MEBANE ONLY): Streptococcus, Group A Screen (Direct): NEGATIVE

## 2012-12-31 MED ORDER — BENZONATATE 100 MG PO CAPS: 100.0000 mg | ORAL_CAPSULE | Freq: Three times a day (TID) | ORAL | Status: DC

## 2012-12-31 MED ORDER — KETOROLAC TROMETHAMINE 30 MG/ML IJ SOLN
30.0000 mg | Freq: Once | INTRAMUSCULAR | Status: AC
  Administered 2012-12-31: 30 mg via INTRAVENOUS
  Filled 2012-12-31: qty 1

## 2012-12-31 MED ORDER — ALBUTEROL SULFATE HFA 108 (90 BASE) MCG/ACT IN AERS
2.0000 | INHALATION_SPRAY | RESPIRATORY_TRACT | Status: DC | PRN
  Administered 2012-12-31: 2 via RESPIRATORY_TRACT
  Filled 2012-12-31: qty 6.7

## 2012-12-31 MED ORDER — SODIUM CHLORIDE 0.9 % IV BOLUS (SEPSIS)
1000.0000 mL | Freq: Once | INTRAVENOUS | Status: AC
  Administered 2012-12-31: 1000 mL via INTRAVENOUS

## 2012-12-31 MED ORDER — IBUPROFEN 800 MG PO TABS: 800.0000 mg | ORAL_TABLET | Freq: Three times a day (TID) | ORAL | Status: DC

## 2012-12-31 NOTE — ED Provider Notes (Signed)
History     CSN: [id number]  Arrival date & time 12/30/12  2151   First MD Initiated Contact with Patient 12/30/12 2355      Chief Complaint  Patient presents with  . Flank Pain    (Consider location/radiation/quality/duration/timing/severity/associated sxs/prior treatment) HPI HX per PT - cough, cold and congestion, onset a few days ago - her husband has the same symptoms at home. No F/C, feels dehydrated and states her urine has a strong odor to to it, no back pain, dysuria, urgency or frequency, some productive sputum, no wheezing. No N/V/D. She gest some bilateral side pain that she attributes to coughing  Past Medical History  Diagnosis Date  . No pertinent past medical history     No past surgical history on file.  No family history on file.  History  Substance Use Topics  . Smoking status: Never Smoker   . Smokeless tobacco: Never Used  . Alcohol Use: 0.6 oz/week    1 Glasses of wine per week    OB History   Grav Para Term Preterm Abortions TAB SAB Ect Mult Living   2 2 2       2       Review of Systems  Constitutional: Negative for fever and chills.  HENT: Positive for congestion, sore throat and voice change. Negative for neck pain and neck stiffness.   Eyes: Negative for pain.  Respiratory: Positive for cough. Negative for shortness of breath and wheezing.   Cardiovascular: Negative for chest pain.  Gastrointestinal: Negative for vomiting and abdominal pain.  Genitourinary: Negative for dysuria.  Musculoskeletal: Negative for back pain.  Skin: Negative for rash.  Neurological: Negative for headaches.  All other systems reviewed and are negative.    Allergies  Strawberry  Home Medications   Current Outpatient Rx  Name  Route  Sig  Dispense  Refill  . dextromethorphan (DELSYM) 30 MG/5ML liquid   Oral   Take 10 mg by mouth as needed for cough.         Marland Kitchen ibuprofen (ADVIL,MOTRIN) 200 MG tablet   Oral   Take 400 mg by mouth every 6 (six) hours  as needed for pain or fever.         . loratadine (CLARITIN) 10 MG tablet   Oral   Take 10 mg by mouth daily.           BP 115/71  Pulse 104  Temp(Src) 98.9 F (37.2 C) (Oral)  Resp 20  SpO2 96%  Physical Exam  Constitutional: She is oriented to person, place, and time. She appears well-developed and well-nourished.  HENT:  Head: Normocephalic and atraumatic.  Mildly dry mm, uvula midline, tonsills enlarged with some exudates  Eyes: EOM are normal. Pupils are equal, round, and reactive to light.  Neck: Normal range of motion. Neck supple. No thyromegaly present.  Cardiovascular: Regular rhythm and intact distal pulses.   Slight tachyardia  Pulmonary/Chest: Effort normal and breath sounds normal. No stridor. No respiratory distress. She has no wheezes. She exhibits no tenderness.  Abdominal: Soft. Bowel sounds are normal. She exhibits no distension. There is no tenderness. There is no rebound and no guarding.  Musculoskeletal: Normal range of motion. She exhibits no edema.  Neurological: She is alert and oriented to person, place, and time.  Skin: Skin is warm and dry.    ED Course  Procedures (including critical care time)  Results for orders placed during the hospital encounter of 12/30/12  RAPID STREP SCREEN  Result Value Range   Streptococcus, Group A Screen (Direct) NEGATIVE  NEGATIVE  CBC WITH DIFFERENTIAL      Result Value Range   WBC 11.0 (*) 4.0 - 10.5 K/uL   RBC 4.56  3.87 - 5.11 MIL/uL   Hemoglobin 14.2  12.0 - 15.0 g/dL   HCT 11.9  14.7 - 82.9 %   MCV 90.4  78.0 - 100.0 fL   MCH 31.1  26.0 - 34.0 pg   MCHC 34.5  30.0 - 36.0 g/dL   RDW 56.2  13.0 - 86.5 %   Platelets 218  150 - 400 K/uL   Neutrophils Relative % 71  43 - 77 %   Neutro Abs 7.8 (*) 1.7 - 7.7 K/uL   Lymphocytes Relative 21  12 - 46 %   Lymphs Abs 2.3  0.7 - 4.0 K/uL   Monocytes Relative 7  3 - 12 %   Monocytes Absolute 0.8  0.1 - 1.0 K/uL   Eosinophils Relative 1  0 - 5 %    Eosinophils Absolute 0.1  0.0 - 0.7 K/uL   Basophils Relative 0  0 - 1 %   Basophils Absolute 0.0  0.0 - 0.1 K/uL  COMPREHENSIVE METABOLIC PANEL      Result Value Range   Sodium 139  135 - 145 mEq/L   Potassium 3.9  3.5 - 5.1 mEq/L   Chloride 102  96 - 112 mEq/L   CO2 26  19 - 32 mEq/L   Glucose, Bld 109 (*) 70 - 99 mg/dL   BUN 15  6 - 23 mg/dL   Creatinine, Ser 7.84  0.50 - 1.10 mg/dL   Calcium 9.6  8.4 - 69.6 mg/dL   Total Protein 7.9  6.0 - 8.3 g/dL   Albumin 4.2  3.5 - 5.2 g/dL   AST 16  0 - 37 U/L   ALT 20  0 - 35 U/L   Alkaline Phosphatase 64  39 - 117 U/L   Total Bilirubin 0.3  0.3 - 1.2 mg/dL   GFR calc non Af Amer >90  >90 mL/min   GFR calc Af Amer >90  >90 mL/min  URINALYSIS, ROUTINE W REFLEX MICROSCOPIC      Result Value Range   Color, Urine YELLOW  YELLOW   APPearance CLEAR  CLEAR   Specific Gravity, Urine 1.033 (*) 1.005 - 1.030   pH 5.5  5.0 - 8.0   Glucose, UA NEGATIVE  NEGATIVE mg/dL   Hgb urine dipstick NEGATIVE  NEGATIVE   Bilirubin Urine NEGATIVE  NEGATIVE   Ketones, ur 15 (*) NEGATIVE mg/dL   Protein, ur NEGATIVE  NEGATIVE mg/dL   Urobilinogen, UA 0.2  0.0 - 1.0 mg/dL   Nitrite NEGATIVE  NEGATIVE   Leukocytes, UA NEGATIVE  NEGATIVE  POCT PREGNANCY, URINE      Result Value Range   Preg Test, Ur NEGATIVE  NEGATIVE   Dg Chest 2 View  12/31/2012   *RADIOLOGY REPORT*  Clinical Data: Bilateral flank pain.  Fever cough and congestion.  CHEST - 2 VIEW  Comparison: None  Findings: The heart size and mediastinal contours are within normal limits.  Both lungs are clear.  The visualized skeletal structures are unremarkable.  IMPRESSION: Negative examination.   Original Report Authenticated By: Signa Kell, M.D.   IVFs, toradol  2:06 AM recheck : feeling better and stable for d.c home, Rx provided with URI precautions verbalized as understood  MDM  URI symptoms  CXR, labs, IVFs  and medications VS and nurses notes reviewed        Sunnie Nielsen,  MD 12/31/12 [phone]

## 2012-12-31 NOTE — ED Notes (Signed)
Dr. Optiz at bedside 

## 2014-06-17 ENCOUNTER — Encounter (HOSPITAL_COMMUNITY): Payer: Self-pay | Admitting: Emergency Medicine

## 2017-05-23 ENCOUNTER — Emergency Department (HOSPITAL_COMMUNITY): Payer: Medicaid Other

## 2017-05-23 ENCOUNTER — Encounter (HOSPITAL_COMMUNITY): Payer: Self-pay | Admitting: Emergency Medicine

## 2017-05-23 ENCOUNTER — Emergency Department (HOSPITAL_COMMUNITY)
Admission: EM | Admit: 2017-05-23 | Discharge: 2017-05-23 | Disposition: A | Discharge status: Home / Self Care | Payer: Medicaid Other | Attending: Emergency Medicine | Admitting: Emergency Medicine

## 2017-05-23 DIAGNOSIS — O4690 Antepartum hemorrhage, unspecified, unspecified trimester: Secondary | ICD-10-CM

## 2017-05-23 DIAGNOSIS — Z79899 Other long term (current) drug therapy: Secondary | ICD-10-CM | POA: Diagnosis not present

## 2017-05-23 DIAGNOSIS — O4691 Antepartum hemorrhage, unspecified, first trimester: Secondary | ICD-10-CM | POA: Insufficient documentation

## 2017-05-23 DIAGNOSIS — Z3A10 10 weeks gestation of pregnancy: Secondary | ICD-10-CM | POA: Diagnosis not present

## 2017-05-23 LAB — BASIC METABOLIC PANEL
Anion gap: 7 (ref 5–15)
BUN: 5 mg/dL — AB (ref 6–20)
CHLORIDE: 106 mmol/L (ref 101–111)
CO2: 24 mmol/L (ref 22–32)
CREATININE: 0.62 mg/dL (ref 0.44–1.00)
Calcium: 9 mg/dL (ref 8.9–10.3)
GFR calc Af Amer: >60 mL/min (ref >60)
Glucose, Bld: 108 mg/dL — ABNORMAL HIGH (ref 65–99)
POTASSIUM: 3.6 mmol/L (ref 3.5–5.1)
SODIUM: 137 mmol/L (ref 135–145)

## 2017-05-23 LAB — URINALYSIS, ROUTINE W REFLEX MICROSCOPIC
BILIRUBIN URINE: NEGATIVE
Glucose, UA: NEGATIVE mg/dL
HGB URINE DIPSTICK: NEGATIVE
Ketones, ur: NEGATIVE mg/dL
Leukocytes, UA: NEGATIVE
NITRITE: NEGATIVE
PROTEIN: NEGATIVE mg/dL
SPECIFIC GRAVITY, URINE: 1.011 (ref 1.005–1.030)
pH: 7 (ref 5.0–8.0)

## 2017-05-23 LAB — WET PREP, GENITAL
SPERM: NONE SEEN
Trich, Wet Prep: NONE SEEN
YEAST WET PREP: NONE SEEN

## 2017-05-23 LAB — CBC
HCT: 37.1 % (ref 36.0–46.0)
HEMOGLOBIN: 12 g/dL (ref 12.0–15.0)
MCH: 29.9 pg (ref 26.0–34.0)
MCHC: 32.3 g/dL (ref 30.0–36.0)
MCV: 92.3 fL (ref 78.0–100.0)
PLATELETS: 229 10*3/uL (ref 150–400)
RBC: 4.02 MIL/uL (ref 3.87–5.11)
RDW: 12.7 % (ref 11.5–15.5)
WBC: 9.8 10*3/uL (ref 4.0–10.5)

## 2017-05-23 LAB — I-STAT BETA HCG BLOOD, ED (MC, WL, AP ONLY)

## 2017-05-23 NOTE — ED Triage Notes (Signed)
Pt reports she is [redacted] weeks pregnant, began having cramping and spotting today, reports spotting is very light. Has not seen OB yet regarding pregnancy.

## 2017-05-23 NOTE — Discharge Instructions (Signed)
YOUR ULTRASOUND RESULTS SHOWED A SINGLE LIVE FETUS IN THE LOWER UTERUS WITH A HEART RATE OF 187. THE PATIENT FOLLOW CLOSELY WITH YOUR ob/gyn CALL TO RESCHEDULE APPOINTMENT. HE DOES NOT APPEAR TO BE ANY CONCERN FOR POTENTIAL MISCARRIAGE AT THIS TIME. YOUR URINE IS CLEAR AND SHOWS NO SIGNS OF INFECTION. PLEASE TAKE PRENATAL VITAMINS AND PARTICIPATED IN PELVIC REST FOR THE NEXT WEEK. INSTRUCTIONS ARE AS LISTED IN THE ATTACHMENTS. Contact a health care provider if: You have any vaginal bleeding during any part of your pregnancy. You have cramps or labor pains. You have a fever, not controlled by medicine. Get help right away if: You have severe cramps in your back or belly (abdomen). You pass large clots or tissue from your vagina. Your bleeding increases. You feel light-headed or weak, or you have fainting episodes. You have chills. You are leaking fluid or have a gush of fluid from your vagina. You pass out while having a bowel movement.

## 2017-05-23 NOTE — ED Provider Notes (Signed)
[facility]-EMERGENCY DEPT Provider Note   CSN: [id number] Arrival date & time: 05/23/17  1059     History   Chief Complaint Chief Complaint  Patient presents with  . Vaginal Bleeding  . Threatened Miscarriage    HPI Danielle Cherry is a 31 y.o. female who presents to the ER who presents  To the ED with cc of vaginal bleeding. She [id number]. LMP was March 12, 2017. Patient states that she went to work today and she began having some cramping and then noticed some pinin her underwear. She complains of cramping which is worse on the left side. She denies any urinary symptoms, nausea or vomiting. She has not gotten any prenatal care and was due for her first prenatal visit today.  HPI  Past Medical History:  Diagnosis Date  . No pertinent past medical history     Patient Active Problem List   Diagnosis Date Noted  . Normal pregnancy 03/17/2011  . Active labor 03/17/2011  . Normal delivery 03/17/2011    History reviewed. No pertinent surgical history.  OB History    Gravida Para Term Preterm AB Living   SAB TAB Ectopic Multiple Live Births           1       Home Medications    Prior to Admission medications   Medication Sig Start Date End Date Taking? Authorizing Provider  Prenatal Vit-Fe Fumarate-FA (MULTIVITAMIN-PRENATAL) 27-0.8 MG TABS tablet Take 1 tablet by mouth daily at 12 noon.   Yes [provider]  benzonatate (TESSALON) 100 MG capsule Take 1 capsule (100 mg total) by mouth every 8 (eight) hours. Patient not taking: Reported on 05/23/2017 12/31/12   Sunnie Nielsen, MD  dextromethorphan [facility]) 30 MG/5ML liquid Take 10 mg by mouth as needed for cough.    [provider]  ibuprofen (ADVIL,MOTRIN) 200 MG tablet Take 400 mg by mouth every 6 (six) hours as needed for pain or fever.    [provider]  ibuprofen (ADVIL,MOTRIN) 800 MG tablet Take 1 tablet (800 mg total) by mouth 3 (three) times daily. Patient not taking: Reported on  05/23/2017 12/31/12   Sunnie Nielsen, MD  loratadine (CLARITIN) 10 MG tablet Take 10 mg by mouth daily.    [provider]    Family History No family history on file.  Social History Social History  Substance Use Topics  . Smoking status: Never Smoker  . Smokeless tobacco: Never Used  . Alcohol use 0.6 oz/week    1 Glasses of wine per week     Allergies   Strawberry extract   Review of Systems Review of Systems Ten systems reviewed and are negative for acute change, except as noted in the HPI.    Physical Exam Updated Vital Signs BP 115/65   Pulse 77   Temp 98.5 F (36.9 C) (Oral)   Resp 12   LMP 03/12/2017 (Exact Date)   SpO2 99%   Physical Exam  Constitutional: She is oriented to person, place, and time. She appears well-developed and well-nourished. No distress.  HENT:  Head: Normocephalic and atraumatic.  Eyes: Conjunctivae are normal. No scleral icterus.  Neck: Normal range of motion.  Cardiovascular: Normal rate, regular rhythm and normal heart sounds.  Exam reveals no gallop and no friction rub.   No murmur heard. Pulmonary/Chest: Effort normal and breath sounds normal. No respiratory distress.  Abdominal: Soft. Bowel sounds are normal. She exhibits no distension  and no mass. There is no tenderness. There is no guarding.  Genitourinary:  Genitourinary Comments: Normal external female genitalia, mild thin white discharge from the vagina, multiparous os with ectropion, closed os without any cervical discharge or bleeding. Tender in the suprapubic region on bimanual examination, no adnexal fullness or tenderness.,  Neurological: She is alert and oriented to person, place, and time.  Skin: Skin is warm and dry. She is not diaphoretic.  Psychiatric: Her behavior is normal.  Nursing note and vitals reviewed.    ED Treatments / Results  Labs (all labs ordered are listed, but only abnormal results are displayed) Labs Reviewed  WET PREP, GENITAL -  Abnormal; Notable for the following:       Result Value   Clue Cells Wet Prep HPF POC PRESENT (*)    WBC, Wet Prep HPF POC MANY (*)    All other components within normal limits  BASIC METABOLIC PANEL - Abnormal; Notable for the following:    Glucose, Bld 108 (*)    BUN 5 (*)    All other components within normal limits  I-STAT BETA HCG BLOOD, ED ([facility], [facility], [facility] ONLY) - Abnormal; Notable for the following:    I-stat hCG, quantitative >2,000.0 (*)    All other components within normal limits  CBC  URINALYSIS, ROUTINE W REFLEX MICROSCOPIC  RPR  HIV ANTIBODY (ROUTINE TESTING)  GC/CHLAMYDIA PROBE AMP ([facility]) NOT AT [facility]    EKG  EKG Interpretation None       Radiology No results found.  Procedures Procedures (including critical care time)  Medications Ordered in ED Medications - No data to display   Initial Impression / Assessment and Plan / ED Course  I have reviewed the triage vital signs and the nursing notes.  Pertinent labs & imaging results that were available during my care of the patient were reviewed by me and considered in my medical decision making (see chart for details).     Patient with single IUP measuring 10 weeks 1 day intrauterine.  No evidence of bleeding or impending abortion. Urine without evidence of infection. Patient does have some clue cells on her wet prep however she is asymptomatic and preferred the current ACOG guidelines patient will not receive treatment.  Final Clinical Impressions(s) / ED Diagnoses   Final diagnoses:  Vaginal bleeding in pregnancy    New Prescriptions New Prescriptions   No medications on file     Arthor Captain, PA-C 05/23/17 1610    Tilden Fossa, MD 05/25/17 [phone]

## 2017-05-24 LAB — GC/CHLAMYDIA PROBE AMP (~~LOC~~) NOT AT ARMC
Chlamydia: NEGATIVE
NEISSERIA GONORRHEA: NEGATIVE

## 2017-06-16 ENCOUNTER — Inpatient Hospital Stay (HOSPITAL_COMMUNITY)
Admission: AD | Admit: 2017-06-16 | Discharge: 2017-06-16 | Disposition: A | Discharge status: Home / Self Care | Payer: Medicaid Other | Source: Ambulatory Visit | Attending: Obstetrics and Gynecology | Admitting: Obstetrics and Gynecology

## 2017-06-16 ENCOUNTER — Encounter (HOSPITAL_COMMUNITY): Payer: Self-pay | Admitting: *Deleted

## 2017-06-16 DIAGNOSIS — Z91018 Allergy to other foods: Secondary | ICD-10-CM | POA: Insufficient documentation

## 2017-06-16 DIAGNOSIS — O219 Vomiting of pregnancy, unspecified: Secondary | ICD-10-CM

## 2017-06-16 DIAGNOSIS — R000 Tachycardia, unspecified: Secondary | ICD-10-CM | POA: Insufficient documentation

## 2017-06-16 DIAGNOSIS — R42 Dizziness and giddiness: Secondary | ICD-10-CM | POA: Insufficient documentation

## 2017-06-16 DIAGNOSIS — O26892 Other specified pregnancy related conditions, second trimester: Secondary | ICD-10-CM | POA: Insufficient documentation

## 2017-06-16 DIAGNOSIS — R110 Nausea: Secondary | ICD-10-CM | POA: Diagnosis present

## 2017-06-16 DIAGNOSIS — Z3A13 13 weeks gestation of pregnancy: Secondary | ICD-10-CM

## 2017-06-16 LAB — GLUCOSE, CAPILLARY: Glucose-Capillary: 73 mg/dL (ref 65–99)

## 2017-06-16 LAB — CBC
HEMATOCRIT: 34 % — AB (ref 36.0–46.0)
HEMOGLOBIN: 12 g/dL (ref 12.0–15.0)
MCH: 32.7 pg (ref 26.0–34.0)
MCHC: 35.3 g/dL (ref 30.0–36.0)
MCV: 92.6 fL (ref 78.0–100.0)
Platelets: 190 10*3/uL (ref 150–400)
RBC: 3.67 MIL/uL — ABNORMAL LOW (ref 3.87–5.11)
RDW: 13.4 % (ref 11.5–15.5)
WBC: 11.4 10*3/uL — ABNORMAL HIGH (ref 4.0–10.5)

## 2017-06-16 LAB — URINALYSIS, ROUTINE W REFLEX MICROSCOPIC
Bilirubin Urine: NEGATIVE
GLUCOSE, UA: NEGATIVE mg/dL
Hgb urine dipstick: NEGATIVE
Ketones, ur: NEGATIVE mg/dL
Nitrite: NEGATIVE
PROTEIN: NEGATIVE mg/dL
Specific Gravity, Urine: 1.012 (ref 1.005–1.030)
pH: 5 (ref 5.0–8.0)

## 2017-06-16 NOTE — MAU Provider Note (Signed)
History     CSN: [id number]  Arrival date and time: 06/16/17 0906   First Provider Initiated Contact with Patient 06/16/17 [phone]      Chief Complaint  Patient presents with  . Tachycardia  . Nausea   G3P2002 @13 .5 weeks here with fast heart rate, dizziness, and nausea. Sx started about 2 weeks ago. Sx occur after she eats. No syncope. She reports craving sugars and has something sweet like candy with almost every meal. This morning she had oatmeal, a peach, grapes, and sweet tea. She admits to poor water intake. No abdominal pain or VB.    OB History    Gravida Para Term Preterm AB Living   3 2 2     2    SAB TAB Ectopic Multiple Live Births           1      Past Medical History:  Diagnosis Date  . No pertinent past medical history     No past surgical history on file.  No family history on file.  Social History  Substance Use Topics  . Smoking status: Never Smoker  . Smokeless tobacco: Never Used  . Alcohol use 0.6 oz/week    1 Glasses of wine per week    Allergies:  Allergies  Allergen Reactions  . Strawberry Extract Anaphylaxis    Prescriptions Prior to Admission  Medication Sig Dispense Refill Last Dose  . benzonatate (TESSALON) 100 MG capsule Take 1 capsule (100 mg total) by mouth every 8 (eight) hours. (Patient not taking: Reported on 05/23/2017) 21 capsule 0 Not Taking at Unknown time  . dextromethorphan (DELSYM) 30 MG/5ML liquid Take 10 mg by mouth as needed for cough.   Completed Course at Unknown time  . ibuprofen (ADVIL,MOTRIN) 200 MG tablet Take 400 mg by mouth every 6 (six) hours as needed for pain or fever.   Not Taking at Unknown time  . ibuprofen (ADVIL,MOTRIN) 800 MG tablet Take 1 tablet (800 mg total) by mouth 3 (three) times daily. (Patient not taking: Reported on 05/23/2017) 21 tablet 0 Not Taking at Unknown time  . loratadine (CLARITIN) 10 MG tablet Take 10 mg by mouth daily.   Not Taking at Unknown time  . Prenatal Vit-Fe Fumarate-FA  (MULTIVITAMIN-PRENATAL) 27-0.8 MG TABS tablet Take 1 tablet by mouth daily at 12 noon.   05/22/2017 at Unknown time    Review of Systems  Constitutional: Negative for fever.  Gastrointestinal: Positive for nausea. Negative for abdominal pain and vomiting.  Genitourinary: Negative for vaginal bleeding.  Neurological: Positive for dizziness. Negative for syncope.   Physical Exam   Blood pressure 121/63, pulse 75, temperature 97.9 F (36.6 C), resp. rate 18, weight 143 lb (64.9 kg), last menstrual period 03/12/2017.  Patient Vitals for the past 24 hrs:  BP Temp Pulse Resp SpO2 Weight  06/16/17 1033 109/80 - 68 - - -  06/16/17 1031 - - - - 95 % -  06/16/17 1030 - - - - 95 % -  06/16/17 1029 112/75 - 67 - - -  06/16/17 1027 115/68 - (!) 56 - 100 % -  06/16/17 1026 109/64 - 60 - 99 % -  06/16/17 0920 121/63 97.9 F (36.6 C) 75 18 - 143 lb (64.9 kg)    Physical Exam  Constitutional: She is oriented to person, place, and time. She appears well-developed and well-nourished. No distress.  HENT:  Head: Normocephalic and atraumatic.  Eyes: Pupils are equal, round, and reactive to light. Conjunctivae and  EOM are normal.  Neck: Normal range of motion.  Cardiovascular: Normal rate, regular rhythm and normal heart sounds.   Respiratory: Effort normal and breath sounds normal. No respiratory distress. She has no wheezes. She has no rales.  Musculoskeletal: Normal range of motion.  Neurological: She is alert and oriented to person, place, and time.  Skin: Skin is warm and dry.  Psychiatric: She has a normal mood and affect.  FHT: 164  Results for orders placed or performed during the hospital encounter of 06/16/17 (from the past 24 hour(s))  Urinalysis, Routine w reflex microscopic     Status: Abnormal   Collection Time: 06/16/17  9:18 AM  Result Value Ref Range   Color, Urine YELLOW YELLOW   APPearance HAZY (A) CLEAR   Specific Gravity, Urine 1.012 1.005 - 1.030   pH 5.0 5.0 - 8.0    Glucose, UA NEGATIVE NEGATIVE mg/dL   Hgb urine dipstick NEGATIVE NEGATIVE   Bilirubin Urine NEGATIVE NEGATIVE   Ketones, ur NEGATIVE NEGATIVE mg/dL   Protein, ur NEGATIVE NEGATIVE mg/dL   Nitrite NEGATIVE NEGATIVE   Leukocytes, UA TRACE (A) NEGATIVE   RBC / HPF 0-5 0 - 5 RBC/hpf   WBC, UA 0-5 0 - 5 WBC/hpf   Bacteria, UA RARE (A) NONE SEEN   Squamous Epithelial / LPF 6-30 (A) NONE SEEN   Mucus PRESENT   CBC     Status: Abnormal   Collection Time: 06/16/17 10:16 AM  Result Value Ref Range   WBC 11.4 (H) 4.0 - 10.5 K/uL   RBC 3.67 (L) 3.87 - 5.11 MIL/uL   Hemoglobin 12.0 12.0 - 15.0 g/dL   HCT 16.1 (L) 09.6 - 04.5 %   MCV 92.6 78.0 - 100.0 fL   MCH 32.7 26.0 - 34.0 pg   MCHC 35.3 30.0 - 36.0 g/dL   RDW 40.9 81.1 - 91.4 %   Platelets 190 150 - 400 K/uL  Glucose, capillary     Status: None   Collection Time: 06/16/17 11:08 AM  Result Value Ref Range   Glucose-Capillary 73 65 - 99 mg/dL  ; MAU Course  Procedures  MDM Labs ordered and reviewed. Not orthostatic. No anemia. Unlikely cardiac etiology. Sx likely caused by high sugar and carb intake causing blood sugar fluctuations. Discussed lower carb/sugar diet, add protein with every meal and snack and increase water intake. Presentation, clinical findings, and plan discussed with Dr. Normand Sloop. Stable for discharge home.  Assessment and Plan   1. [redacted] weeks gestation of pregnancy   2. Nausea and vomiting during pregnancy   3. Dizziness   4. Heart rate fast    Discharge home Follow up in OB office next week as scheduled List provided for OTC nausea meds  Allergies as of 06/16/2017      Reactions   Strawberry Extract Anaphylaxis      Medication List    STOP taking these medications   benzonatate 100 MG capsule Commonly known as:  TESSALON   DELSYM 30 MG/5ML liquid Generic drug:  dextromethorphan   ibuprofen 200 MG tablet Commonly known as:  ADVIL,MOTRIN   ibuprofen 800 MG tablet Commonly known as:  ADVIL,MOTRIN      TAKE these medications   loratadine 10 MG tablet Commonly known as:  CLARITIN Take 10 mg by mouth daily.   multivitamin-prenatal 27-0.8 MG Tabs tablet Take 1 tablet by mouth daily at 12 noon.      Donette Larry, CNM 06/16/2017, 10:09 AM

## 2017-06-16 NOTE — MAU Note (Signed)
Pt presents to MAU with complaints of feeling light headed and nausea for a couple of weeks. Denies any VB or abnormal discharge

## 2017-06-16 NOTE — Discharge Instructions (Signed)
Morning Sickness Morning sickness is when you feel sick to your stomach (nauseous) during pregnancy. You may feel sick to your stomach and throw up (vomit). You may feel sick in the morning, but you can feel this way any time of day. Some women feel very sick to their stomach and cannot stop throwing up (hyperemesis gravidarum). Follow these instructions at home:  Only take medicines as told by your doctor.  Take multivitamins as told by your doctor. Taking multivitamins before getting pregnant can stop or lessen the harshness of morning sickness.  Eat dry toast or unsalted crackers before getting out of bed.  Eat 5 to 6 small meals a day.  Eat dry and bland foods like rice and baked potatoes.  Do not drink liquids with meals. Drink between meals.  Do not eat greasy, fatty, or spicy foods.  Have someone cook for you if the smell of food causes you to feel sick or throw up.  If you feel sick to your stomach after taking prenatal vitamins, take them at night or with a snack.  Eat protein when you need a snack (nuts, yogurt, cheese).  Eat unsweetened gelatins for dessert.  Wear a bracelet used for sea sickness (acupressure wristband).  Go to a doctor that puts thin needles into certain body points (acupuncture) to improve how you feel.  Do not smoke.  Use a humidifier to keep the air in your house free of odors.  Get lots of fresh air. Contact a doctor if:  You need medicine to feel better.  You feel dizzy or lightheaded.  You are losing weight. Get help right away if:  You feel very sick to your stomach and cannot stop throwing up.  You pass out (faint). This information is not intended to replace advice given to you by your health care provider. Make sure you discuss any questions you have with your health care provider. Document Released: 09/09/2004 Document Revised: 01/08/2016 Document Reviewed: 01/17/2013 Elsevier Interactive Patient Education  2017 [employer].  Eating Plan for Pregnant Women While you are pregnant, your body will require additional nutrition to help support your growing baby. It is recommended that you consume:  150 additional calories each day during your first trimester.  300 additional calories each day during your second trimester.  300 additional calories each day during your third trimester.  Eating a healthy, well-balanced diet is very important for your health and for your baby's health. You also have a higher need for some vitamins and minerals, such as folic acid, calcium, iron, and vitamin D. What do I need to know about eating during pregnancy?  Do not try to lose weight or go on a diet during pregnancy.  Choose healthy, nutritious foods. Choose  of a sandwich with a glass of milk instead of a candy bar or a high-calorie sugar-sweetened beverage.  Limit your overall intake of foods that have "empty calories." These are foods that have little nutritional value, such as sweets, desserts, candies, sugar-sweetened beverages, and fried foods.  Eat a variety of foods, especially fruits and vegetables.  Take a prenatal vitamin to help meet the additional needs during pregnancy, specifically for folic acid, iron, calcium, and vitamin D.  Remember to stay active. Ask your health care provider for exercise recommendations that are specific to you.  Practice good food safety and cleanliness, such as washing your hands before you eat and after you prepare raw meat. This helps to prevent foodborne illnesses, such as listeriosis, that  can be very dangerous for your baby. Ask your health care provider for more information about listeriosis. What does 150 extra calories look like? Healthy options for an additional 150 calories each day could be any of the following:  Plain low-fat yogurt (6-8 oz) with  cup of berries.  1 apple with 2 teaspoons of peanut butter.  Cut-up vegetables with  cup of hummus.  Low-fat  chocolate milk (8 oz or 1 cup).  1 string cheese with 1 medium orange.   of a peanut butter and jelly sandwich on whole-wheat bread (1 tsp of peanut butter).  For 300 calories, you could eat two of those healthy options each day. What is a healthy amount of weight to gain? The recommended amount of weight for you to gain is based on your pre-pregnancy BMI. If your pre-pregnancy BMI was:  Less than 18 (underweight), you should gain 28-40 lb.  18-24.9 (normal), you should gain 25-35 lb.  25-29.9 (overweight), you should gain 15-25 lb.  Greater than 30 (obese), you should gain 11-20 lb.  What if I am having twins or multiples? Generally, pregnant women who will be having twins or multiples may need to increase their daily calories by 300-600 calories each day. The recommended range for total weight gain is 25-54 lb, depending on your pre-pregnancy BMI. Talk with your health care provider for specific guidance about additional nutritional needs, weight gain, and exercise during your pregnancy. What foods can I eat? Grains Any grains. Try to choose whole grains, such as whole-wheat bread, oatmeal, or brown rice. Vegetables Any vegetables. Try to eat a variety of colors and types of vegetables to get a full range of vitamins and minerals. Remember to wash your vegetables well before eating. Fruits Any fruits. Try to eat a variety of colors and types of fruit to get a full range of vitamins and minerals. Remember to wash your fruits well before eating. Meats and Other Protein Sources Lean meats, including chicken, [place], fish, and lean cuts of beef, veal, or pork. Make sure that all meats are cooked to "well done." Tofu. Tempeh. Beans. Eggs. Peanut butter and other nut butters. Seafood, such as shrimp, crab, and lobster. If you choose fish, select types that are higher in omega-3 fatty acids, including salmon, herring, mussels, trout, sardines, and pollock. Make sure that all meats are cooked  to food-safe temperatures. Dairy Pasteurized milk and milk alternatives. Pasteurized yogurt and pasteurized cheese. Cottage cheese. Sour cream. Beverages Water. Juices that contain 100% fruit juice or vegetable juice. Caffeine-free teas and decaffeinated coffee. Drinks that contain caffeine are okay to drink, but it is better to avoid caffeine. Keep your total caffeine intake to less than 200 mg each day (12 oz of coffee, tea, or soda) or as directed by your health care provider. Condiments Any pasteurized condiments. Sweets and Desserts Any sweets and desserts. Fats and Oils Any fats and oils. The items listed above may not be a complete list of recommended foods or beverages. Contact your dietitian for more options. What foods are not recommended? Vegetables Unpasteurized (raw) vegetable juices. Fruits Unpasteurized (raw) fruit juices. Meats and Other Protein Sources Cured meats that have nitrates, such as bacon, salami, and hotdogs. Luncheon meats, bologna, or other deli meats (unless they are reheated until they are steaming hot). Refrigerated pate, meat spreads from a meat counter, smoked seafood that is found in the refrigerated section of a store. Raw fish, such as sushi or sashimi. High mercury content fish, such as  tilefish, shark, swordfish, and king mackerel. Raw meats, such as tuna or beef tartare. Undercooked meats and poultry. Make sure that all meats are cooked to food-safe temperatures. Dairy Unpasteurized (raw) milk and any foods that have raw milk in them. Soft cheeses, such as feta, queso blanco, queso fresco, Brie, Camembert cheeses, blue-veined cheeses, and Panela cheese (unless it is made with pasteurized milk, which must be stated on the label). Beverages Alcohol. Sugar-sweetened beverages, such as sodas, teas, or energy drinks. Condiments Homemade fermented foods and drinks, such as pickles, sauerkraut, or kombucha drinks. (Store-bought pasteurized versions of these  are okay.) Other Salads that are made in the store, such as ham salad, chicken salad, egg salad, tuna salad, and seafood salad. The items listed above may not be a complete list of foods and beverages to avoid. Contact your dietitian for more information. This information is not intended to replace advice given to you by your health care provider. Make sure you discuss any questions you have with your health care provider. Document Released: 05/17/2014 Document Revised: 01/08/2016 Document Reviewed: 01/15/2014 Elsevier Interactive Patient Education  [employer].

## 2017-08-16 NOTE — L&D Delivery Note (Signed)
Delivery Note At  a viable female was delivered via  (Presentation:LOA ;  ).  APGAR:9 ,10 ; weight pending  .   Placenta status:complete , .3V  Cord:  with the following complications:None .    Anesthesia:  None Episiotomy:  None Lacerations:  None Suture Repair: N/A Est. Blood Loss (mL):200cc    Mom to postpartum.  Baby to Couplet care / Skin to Skin.  Danielle Cherry 12/12/2017, 3:13 AM

## 2017-11-23 LAB — OB RESULTS CONSOLE GBS: GBS: NEGATIVE

## 2017-11-29 ENCOUNTER — Inpatient Hospital Stay (HOSPITAL_COMMUNITY)
Admission: AD | Admit: 2017-11-29 | Discharge: 2017-11-29 | Disposition: A | Discharge status: Home / Self Care | Payer: Medicaid Other | Source: Ambulatory Visit | Attending: Obstetrics and Gynecology | Admitting: Obstetrics and Gynecology

## 2017-11-29 ENCOUNTER — Other Ambulatory Visit: Payer: Self-pay

## 2017-11-29 ENCOUNTER — Encounter (HOSPITAL_COMMUNITY): Payer: Self-pay

## 2017-11-29 DIAGNOSIS — Z3A37 37 weeks gestation of pregnancy: Secondary | ICD-10-CM | POA: Diagnosis not present

## 2017-11-29 DIAGNOSIS — O9989 Other specified diseases and conditions complicating pregnancy, childbirth and the puerperium: Secondary | ICD-10-CM

## 2017-11-29 DIAGNOSIS — O26893 Other specified pregnancy related conditions, third trimester: Secondary | ICD-10-CM | POA: Insufficient documentation

## 2017-11-29 DIAGNOSIS — A084 Viral intestinal infection, unspecified: Secondary | ICD-10-CM

## 2017-11-29 DIAGNOSIS — R197 Diarrhea, unspecified: Secondary | ICD-10-CM | POA: Diagnosis present

## 2017-11-29 LAB — URINALYSIS, ROUTINE W REFLEX MICROSCOPIC
BACTERIA UA: NONE SEEN
BILIRUBIN URINE: NEGATIVE
Glucose, UA: 50 mg/dL — AB
HGB URINE DIPSTICK: NEGATIVE
KETONES UR: NEGATIVE mg/dL
NITRITE: NEGATIVE
Protein, ur: NEGATIVE mg/dL
SPECIFIC GRAVITY, URINE: 1.017 (ref 1.005–1.030)
pH: 6 (ref 5.0–8.0)

## 2017-11-29 MED ORDER — SODIUM CHLORIDE 0.9 % IV SOLN
8.0000 mg | Freq: Once | INTRAVENOUS | Status: DC
  Filled 2017-11-29: qty 4

## 2017-11-29 MED ORDER — LOPERAMIDE HCL 2 MG PO CAPS
4.0000 mg | ORAL_CAPSULE | Freq: Once | ORAL | Status: AC
  Administered 2017-11-29: 4 mg via ORAL
  Filled 2017-11-29: qty 2

## 2017-11-29 MED ORDER — LACTATED RINGERS IV BOLUS
1000.0000 mL | Freq: Once | INTRAVENOUS | Status: AC
  Administered 2017-11-29: 1000 mL via INTRAVENOUS

## 2017-11-29 NOTE — MAU Note (Signed)
Urine sent to lab 

## 2017-11-29 NOTE — MAU Provider Note (Addendum)
History     CSN: [id number]  Arrival date and time: 11/29/17 1646   First Provider Initiated Contact with Patient 11/29/17 1846      Chief Complaint  Patient presents with  . Diarrhea   Danielle Cherry is a 32 y.o. [id number] at 4139w3d who presents today with diarrhea since around 2200 on 12/28/17. She reports that around 2000 she had some seafood dip, but other people ate this as well and did not get sick. However she has had nausea and diarrhea. She denies any VB, LOF or contractions. She reports normal fetal movement.   Diarrhea   This is a new problem. The current episode started yesterday. The problem occurs more than 10 times per day. The problem has been unchanged. The stool consistency is described as watery. The patient states that diarrhea awakens her from sleep. Pertinent negatives include no chills or fever. Risk factors include suspect food intake (ate seafood monday diarrhea started about 2 hours afterward. ). She has tried nothing for the symptoms.   Past Medical History:  Diagnosis Date  . No pertinent past medical history     History reviewed. No pertinent surgical history.  Family History  Problem Relation Age of Onset  . Diabetes Mother   . Hyperlipidemia Father   . Cancer Paternal Grandfather     Social History   Tobacco Use  . Smoking status: Never Smoker  . Smokeless tobacco: Never Used  Substance Use Topics  . Alcohol use: Yes    Alcohol/week: 0.6 oz    Types: 1 Glasses of wine per week  . Drug use: No    Allergies:  Allergies  Allergen Reactions  . Strawberry Extract Anaphylaxis    Medications Prior to Admission  Medication Sig Dispense Refill Last Dose  . loratadine (CLARITIN) 10 MG tablet Take 10 mg by mouth daily.   Not Taking at Unknown time  . Prenatal Vit-Fe Fumarate-FA (MULTIVITAMIN-PRENATAL) 27-0.8 MG TABS tablet Take 1 tablet by mouth daily at 12 noon.   05/22/2017 at Unknown time    Review of Systems  Constitutional: Negative for  chills and fever.  Gastrointestinal: Positive for diarrhea and nausea.  Genitourinary: Negative for dysuria, frequency, pelvic pain, urgency, vaginal bleeding and vaginal discharge.   Physical Exam   Blood pressure 119/72, pulse (!) 107, temperature 98.2 F (36.8 C), temperature source Oral, resp. rate 18, weight 171 lb (77.6 kg), last menstrual period 03/12/2017, SpO2 100 %.  Physical Exam  Nursing note and vitals reviewed. Constitutional: She is oriented to person, place, and time. She appears well-developed and well-nourished. No distress.  HENT:  Head: Normocephalic.  Cardiovascular: Normal rate.  Respiratory: Effort normal.  GI: Soft. There is no tenderness. There is no rebound.  Genitourinary:  Genitourinary Comments: Cervix: 3/7/0/-2/vtx, unchanged from exam in the office today.   Neurological: She is alert and oriented to person, place, and time.  Skin: Skin is warm and dry.  Psychiatric: She has a normal mood and affect.   FHT: 150, moderate with 15x15 accels, no decels Toco: occasional   Results for orders placed or performed during the hospital encounter of 11/29/17 (from the past 24 hour(s))  Urinalysis, Routine w reflex microscopic     Status: Abnormal   Collection Time: 11/29/17  5:00 PM  Result Value Ref Range   Color, Urine YELLOW YELLOW   APPearance HAZY (A) CLEAR   Specific Gravity, Urine 1.017 1.005 - 1.030   pH 6.0 5.0 - 8.0   Glucose, UA 50 (  A) NEGATIVE mg/dL   Hgb urine dipstick NEGATIVE NEGATIVE   Bilirubin Urine NEGATIVE NEGATIVE   Ketones, ur NEGATIVE NEGATIVE mg/dL   Protein, ur NEGATIVE NEGATIVE mg/dL   Nitrite NEGATIVE NEGATIVE   Leukocytes, UA TRACE (A) NEGATIVE   RBC / HPF 0-5 0 - 5 RBC/hpf   WBC, UA 6-30 0 - 5 WBC/hpf   Bacteria, UA NONE SEEN NONE SEEN   Squamous Epithelial / LPF 0-5 (A) NONE SEEN   Mucus PRESENT     MAU Course  Procedures  MDM Patient given 1L of LR and 4mg  imodium  1945: DW Dr. Estanislado Pandy, ok for DC home after fluids.  Give instructions on BRAT diet.  Assessment and Plan   1. Viral gastroenteritis   2. [redacted] weeks gestation of pregnancy    DC home Comfort measures reviewed  3rd Trimester precautions  labor precautions  Fetal kick counts RX: none, OTC med list given  Return to MAU as needed FU with OB as planned  Follow-up Information    [facility] Follow up.   Specialty:  Obstetrics and Gynecology Contact information: [address] [place] [address] [phone]           Thressa Sheller 11/29/2017, 6:50 PM

## 2017-11-29 NOTE — MAU Note (Signed)
Sunday night, started having really bad heartburn.  On Monday, started having some diarrhea during the night.  Heartburn got worse, became nauseated.  Woke up this morning feeling worse, heartburn and diarrhea worse.  Started having body aches. No fever.  Having pain in upper abd- epigastric area. Not sure if contracting. No bleeding or water leaking.

## 2017-11-29 NOTE — Discharge Instructions (Signed)
Safe Medications in Pregnancy  ° °Acne: °Benzoyl Peroxide °Salicylic Acid ° °Backache/Headache: °Tylenol: 2 regular strength every 4 hours OR °             2 Extra strength every 6 hours ° °Colds/Coughs/Allergies: °Benadryl (alcohol free) 25 mg every 6 hours as needed °Breath right strips °Claritin °Cepacol throat lozenges °Chloraseptic throat spray °Cold-Eeze- up to three times per day °Cough drops, alcohol free °Flonase (by prescription only) °Guaifenesin °Mucinex °Robitussin DM (plain only, alcohol free) °Saline nasal spray/drops °Sudafed (pseudoephedrine) & Actifed ** use only after [redacted] weeks gestation and if you do not have high blood pressure °Tylenol °Vicks Vaporub °Zinc lozenges °Zyrtec  ° °Constipation: °Colace °Ducolax suppositories °Fleet enema °Glycerin suppositories °Metamucil °Milk of magnesia °Miralax °Senokot °Smooth move tea ° °Diarrhea: °Kaopectate °Imodium A-D ° °*NO pepto Bismol ° °Hemorrhoids: °Anusol °Anusol HC °Preparation H °Tucks ° °Indigestion: °Tums °Maalox °Mylanta °Zantac  °Pepcid ° °Insomnia: °Benadryl (alcohol free) 25mg every 6 hours as needed °Tylenol PM °Unisom, no Gelcaps ° °Leg Cramps: °Tums °MagGel ° °Nausea/Vomiting:  °Bonine °Dramamine °Emetrol °Ginger extract °Sea bands °Meclizine  °Nausea medication to take during pregnancy:  °Unisom (doxylamine succinate 25 mg tablets) Take one tablet daily at bedtime. If symptoms are not adequately controlled, the dose can be increased to a maximum recommended dose of two tablets daily (1/2 tablet in the morning, 1/2 tablet mid-afternoon and one at bedtime). °Vitamin B6 100mg tablets. Take one tablet twice a day (up to 200 mg per day). ° °Skin Rashes: °Aveeno products °Benadryl cream or 25mg every 6 hours as needed °Calamine Lotion °1% cortisone cream ° °Yeast infection: °Gyne-lotrimin 7 °Monistat 7 ° ° °**If taking multiple medications, please check labels to avoid duplicating the same active ingredients °**take medication as directed on  the label °** Do not exceed 4000 mg of tylenol in 24 hours °**Do not take medications that contain aspirin or ibuprofen ° ° ° ° °Food Choices to Help Relieve Diarrhea, Adult °When you have diarrhea, the foods you eat and your eating habits are very important. Choosing the right foods and drinks can help: °· Relieve diarrhea. °· Replace lost fluids and nutrients. °· Prevent dehydration. ° °What general guidelines should I follow? °Relieving diarrhea °· Choose foods with less than 2 g or .07 oz. of fiber per serving. °· Limit fats to less than 8 tsp (38 g or 1.34 oz.) a day. °· Avoid the following: °? Foods and beverages sweetened with high-fructose corn syrup, honey, or sugar alcohols such as xylitol, sorbitol, and mannitol. °? Foods that contain a lot of fat or sugar. °? Fried, greasy, or spicy foods. °? High-fiber grains, breads, and cereals. °? Raw fruits and vegetables. °· Eat foods that are rich in probiotics. These foods include dairy products such as yogurt and fermented milk products. They help increase healthy bacteria in the stomach and intestines (gastrointestinal tract, or GI tract). °· If you have lactose intolerance, avoid dairy products. These may make your diarrhea worse. °· Take medicine to help stop diarrhea (antidiarrheal medicine) only as told by your health care provider. °Replacing nutrients °· Eat small meals or snacks every 3-4 hours. °· Eat bland foods, such as white rice, toast, or baked potato, until your diarrhea starts to get better. Gradually reintroduce nutrient-rich foods as tolerated or as told by your health care provider. This includes: °? Well-cooked protein foods. °? Peeled, seeded, and soft-cooked fruits and vegetables. °? Low-fat dairy products. °· Take vitamin and mineral supplements as told   by your health care provider. °Preventing dehydration ° °· Start by sipping water or a special solution to prevent dehydration (oral rehydration solution, ORS). Urine that is clear or pale  yellow means that you are getting enough fluid. °· Try to drink at least 8-10 cups of fluid each day to help replace lost fluids. °· You may add other liquids in addition to water, such as clear juice or decaffeinated sports drinks, as tolerated or as told by your health care provider. °· Avoid drinks with caffeine, such as coffee, tea, or soft drinks. °· Avoid alcohol. °What foods are recommended? °The items listed may not be a complete list. Talk with your health care provider about what dietary choices are best for you. °Grains °White rice. White, French, or pita breads (fresh or toasted), including plain rolls, buns, or bagels. White pasta. Saltine, soda, or graham crackers. Pretzels. Low-fiber cereal. Cooked cereals made with water (such as cornmeal, farina, or cream cereals). Plain muffins. Matzo. Melba toast. Zwieback. °Vegetables °Potatoes (without the skin). Most well-cooked and canned vegetables without skins or seeds. Tender lettuce. °Fruits °Apple sauce. Fruits canned in juice. Cooked apricots, cherries, grapefruit, peaches, pears, or plums. Fresh bananas and cantaloupe. °Meats and other protein foods °Baked or boiled chicken. Eggs. Tofu. Fish. Seafood. Smooth nut butters. Ground or well-cooked tender beef, ham, veal, lamb, pork, or poultry. °Dairy °Plain yogurt, kefir, and unsweetened liquid yogurt. Lactose-free milk, buttermilk, skim milk, or soy milk. Low-fat or nonfat hard cheese. °Beverages °Water. Low-calorie sports drinks. Fruit juices without pulp. Strained tomato and vegetable juices. Decaffeinated teas. Sugar-free beverages not sweetened with sugar alcohols. Oral rehydration solutions, if approved by your health care provider. °Seasoning and other foods °Bouillon, broth, or soups made from recommended foods. °What foods are not recommended? °The items listed may not be a complete list. Talk with your health care provider about what dietary choices are best for you. °Grains °Whole grain, whole  wheat, bran, or rye breads, rolls, pastas, and crackers. Wild or brown rice. Whole grain or bran cereals. Barley. Oats and oatmeal. Corn tortillas or taco shells. Granola. Popcorn. °Vegetables °Raw vegetables. Fried vegetables. Cabbage, broccoli, Brussels sprouts, artichokes, baked beans, beet greens, corn, kale, legumes, peas, sweet potatoes, and yams. Potato skins. Cooked spinach and cabbage. °Fruits °Dried fruit, including raisins and dates. Raw fruits. Stewed or dried prunes. Canned fruits with syrup. °Meat and other protein foods °Fried or fatty meats. Deli meats. Chunky nut butters. Nuts and seeds. Beans and lentils. Bacon. Hot dogs. Sausage. °Dairy °High-fat cheeses. Whole milk, chocolate milk, and beverages made with milk, such as milk shakes. Half-and-half. Cream. sour cream. Ice cream. °Beverages °Caffeinated beverages (such as coffee, tea, soda, or energy drinks). Alcoholic beverages. Fruit juices with pulp. Prune juice. Soft drinks sweetened with high-fructose corn syrup or sugar alcohols. High-calorie sports drinks. °Fats and oils °Butter. Cream sauces. Margarine. Salad oils. Plain salad dressings. Olives. Avocados. Mayonnaise. °Sweets and desserts °Sweet rolls, doughnuts, and sweet breads. Sugar-free desserts sweetened with sugar alcohols such as xylitol and sorbitol. °Seasoning and other foods °Honey. Hot sauce. Chili powder. Gravy. Cream-based or milk-based soups. Pancakes and waffles. °Summary °· When you have diarrhea, the foods you eat and your eating habits are very important. °· Make sure you get at least 8-10 cups of fluid each day, or enough to keep your urine clear or pale yellow. °· Eat bland foods and gradually reintroduce healthy, nutrient-rich foods as tolerated, or as told by your health care provider. °· Avoid high-fiber, fried,   greasy, or spicy foods. °This information is not intended to replace advice given to you by your health care provider. Make sure you discuss any questions you  have with your health care provider. °Document Released: 10/23/2003 Document Revised: 07/30/2016 Document Reviewed: 07/30/2016 °Elsevier Interactive Patient Education © 2018 [employer] ° °

## 2017-12-09 ENCOUNTER — Other Ambulatory Visit: Payer: Self-pay

## 2017-12-09 ENCOUNTER — Encounter (HOSPITAL_COMMUNITY): Payer: Self-pay | Admitting: *Deleted

## 2017-12-09 ENCOUNTER — Inpatient Hospital Stay (HOSPITAL_COMMUNITY)
Admission: AD | Admit: 2017-12-09 | Discharge: 2017-12-09 | Disposition: A | Discharge status: Home / Self Care | Payer: Medicaid Other | Source: Ambulatory Visit | Attending: Obstetrics and Gynecology | Admitting: Obstetrics and Gynecology

## 2017-12-09 DIAGNOSIS — O368130 Decreased fetal movements, third trimester, not applicable or unspecified: Secondary | ICD-10-CM | POA: Diagnosis present

## 2017-12-09 DIAGNOSIS — Z87891 Personal history of nicotine dependence: Secondary | ICD-10-CM | POA: Diagnosis not present

## 2017-12-09 DIAGNOSIS — Z3689 Encounter for other specified antenatal screening: Secondary | ICD-10-CM | POA: Diagnosis not present

## 2017-12-09 DIAGNOSIS — Z3A38 38 weeks gestation of pregnancy: Secondary | ICD-10-CM | POA: Insufficient documentation

## 2017-12-09 DIAGNOSIS — O9989 Other specified diseases and conditions complicating pregnancy, childbirth and the puerperium: Secondary | ICD-10-CM

## 2017-12-09 HISTORY — DX: Major depressive disorder, single episode, unspecified: F32.9

## 2017-12-09 HISTORY — DX: Depression, unspecified: F32.A

## 2017-12-09 NOTE — MAU Note (Signed)
Sent from office for further eval.  irreg contractions, non-reactive NST.  Was 5 cm.  No bleeding or leaking.

## 2017-12-09 NOTE — Discharge Instructions (Signed)

## 2017-12-09 NOTE — MAU Note (Signed)
Urine sent to lab 

## 2017-12-09 NOTE — MAU Provider Note (Signed)
Patient Danielle Cherry is a 32 y.o. [id number] At [id number] here after being sent from the office with a non-reactive NST. Patient denies bleeding, leaking of fluid. She has not felt that baby move while in MAU but she felt the baby move a lot at her doctors office a few hours ago.  History     CSN: [id number]  Arrival date and time: 12/09/17 1302   None     Chief Complaint  Patient presents with  . Contractions  . non-reactive tracing   HPI Patient is here after having a non-reassuring NST at her doctor's office today. Patient said that "everytime I had a contraction, the heart would go down". Patient was sent here for an additional NST.  OB History    Gravida  4   Para  2   Term  2   Preterm      AB  1   Living  2     SAB  1   TAB      Ectopic      Multiple      Live Births  2        Obstetric Comments  Bled after delivery of daughter, "had to get a shot"        Past Medical History:  Diagnosis Date  . Depression    took meds briefly, going to therapy, doing good  . No pertinent past medical history     Past Surgical History:  Procedure Laterality Date  . NO PAST SURGERIES      Family History  Problem Relation Age of Onset  . Diabetes Mother   . Hypertension Mother   . Hyperlipidemia Father   . Cancer Paternal Grandfather        lung  . Parkinson's disease Paternal Grandmother     Social History   Tobacco Use  . Smoking status: Former Smoker    Types: Cigarettes  . Smokeless tobacco: Never Used  . Tobacco comment: quit 2009  Substance Use Topics  . Alcohol use: Yes    Alcohol/week: 0.6 oz    Types: 1 Glasses of wine per week    Comment: social, not with preg  . Drug use: No    Allergies:  Allergies  Allergen Reactions  . Strawberry Extract Anaphylaxis    Medications Prior to Admission  Medication Sig Dispense Refill Last Dose  . calcium carbonate (TUMS - DOSED IN MG ELEMENTAL CALCIUM) 500 MG chewable tablet Chew 2-3 tablets by  mouth 3 (three) times daily as needed for indigestion or heartburn.   11/29/2017 at Unknown time  . flintstones complete (FLINTSTONES) 60 MG chewable tablet Chew 2 tablets by mouth daily.   Past Week at Unknown time    Review of Systems  Constitutional: Negative.   HENT: Negative.   Cardiovascular: Negative.   Gastrointestinal: Negative.   Genitourinary: Negative.   Musculoskeletal: Negative.   Neurological: Negative.   Hematological: Negative.   Psychiatric/Behavioral: Negative.    Physical Exam   Blood pressure 114/75, pulse 90, temperature 99 F (37.2 C), temperature source Oral, resp. rate 16, weight 171 lb (77.6 kg), last menstrual period 03/12/2017, SpO2 99 %.  Physical Exam  Constitutional: She appears well-developed.  HENT:  Head: Normocephalic.  Neck: Normal range of motion.  Respiratory: Effort normal.  GI: Soft.  Genitourinary:  Genitourinary Comments: Normal external female genitalia; cervix is 5 cm, 70/-1.   Skin: Skin is warm and dry.    MAU Course  Procedures  MDM -NST:  135 bpm, mod var, present acel, neg decels, irregular contractions.  Patient does not feel her contractions; her cervix is unchanged from her exam this morning.  Discussed patient's NST and physical exam with  Assessment and Plan   1. NST (non-stress test) reactive    2. Patient stable for discharge with recommendations to keep appt next week.  3. Return precautions reviewed, including, bleeding, leaking and decreased fetal movements.  4. Patient verbalized understanding.   Charlesetta Garibaldi Dmoni Fortson 12/09/2017, 1:51 PM

## 2017-12-12 ENCOUNTER — Inpatient Hospital Stay (HOSPITAL_COMMUNITY)
Admission: AD | Admit: 2017-12-12 | Discharge: 2017-12-13 | DRG: 807 | Disposition: A | Discharge status: Home / Self Care | Payer: Medicaid Other | Source: Ambulatory Visit | Attending: Obstetrics and Gynecology | Admitting: Obstetrics and Gynecology

## 2017-12-12 ENCOUNTER — Encounter (HOSPITAL_COMMUNITY): Payer: Self-pay | Admitting: *Deleted

## 2017-12-12 DIAGNOSIS — Z3A39 39 weeks gestation of pregnancy: Secondary | ICD-10-CM | POA: Diagnosis not present

## 2017-12-12 DIAGNOSIS — F329 Major depressive disorder, single episode, unspecified: Secondary | ICD-10-CM | POA: Diagnosis present

## 2017-12-12 DIAGNOSIS — O4292 Full-term premature rupture of membranes, unspecified as to length of time between rupture and onset of labor: Secondary | ICD-10-CM | POA: Diagnosis present

## 2017-12-12 DIAGNOSIS — Z87891 Personal history of nicotine dependence: Secondary | ICD-10-CM

## 2017-12-12 DIAGNOSIS — F32A Depression, unspecified: Secondary | ICD-10-CM | POA: Diagnosis not present

## 2017-12-12 DIAGNOSIS — O99344 Other mental disorders complicating childbirth: Secondary | ICD-10-CM | POA: Diagnosis present

## 2017-12-12 LAB — CBC
HCT: 39.5 % (ref 36.0–46.0)
HCT: 36 % (ref 36.0–46.0)
Hemoglobin: 13.2 g/dL (ref 12.0–15.0)
Hemoglobin: 12 g/dL (ref 12.0–15.0)
MCH: 30.3 pg (ref 26.0–34.0)
MCH: 30.1 pg (ref 26.0–34.0)
MCHC: 33.4 g/dL (ref 30.0–36.0)
MCHC: 33.3 g/dL (ref 30.0–36.0)
MCV: 90.8 fL (ref 78.0–100.0)
MCV: 90.2 fL (ref 78.0–100.0)
PLATELETS: 188 10*3/uL (ref 150–400)
Platelets: 196 10*3/uL (ref 150–400)
RBC: 4.35 MIL/uL (ref 3.87–5.11)
RBC: 3.99 MIL/uL (ref 3.87–5.11)
RDW: 14 % (ref 11.5–15.5)
RDW: 13.9 % (ref 11.5–15.5)
WBC: 13.1 10*3/uL — AB (ref 4.0–10.5)
WBC: 21.5 10*3/uL — ABNORMAL HIGH (ref 4.0–10.5)

## 2017-12-12 LAB — ABO/RH: ABO/RH(D): A POS

## 2017-12-12 LAB — TYPE AND SCREEN
ABO/RH(D): A POS
ANTIBODY SCREEN: NEGATIVE

## 2017-12-12 LAB — POCT FERN TEST: POCT Fern Test: POSITIVE

## 2017-12-12 LAB — RPR: RPR: NONREACTIVE

## 2017-12-12 MED ORDER — FLEET ENEMA 7-19 GM/118ML RE ENEM: 1.0000 | ENEMA | RECTAL | Status: DC | PRN

## 2017-12-12 MED ORDER — ACETAMINOPHEN 325 MG PO TABS
650.0000 mg | ORAL_TABLET | ORAL | Status: DC | PRN
  Administered 2017-12-12: 650 mg via ORAL
  Filled 2017-12-12: qty 2

## 2017-12-12 MED ORDER — PRENATAL MULTIVITAMIN CH
1.0000 | ORAL_TABLET | Freq: Every day | ORAL | Status: DC
  Administered 2017-12-12 – 2017-12-13 (×2): 1 via ORAL
  Filled 2017-12-12 (×2): qty 1

## 2017-12-12 MED ORDER — ONDANSETRON HCL 4 MG/2ML IJ SOLN: 4.0000 mg | Freq: Four times a day (QID) | INTRAMUSCULAR | Status: DC | PRN

## 2017-12-12 MED ORDER — OXYTOCIN 40 UNITS IN LACTATED RINGERS INFUSION - SIMPLE MED
2.5000 [IU]/h | INTRAVENOUS | Status: DC
  Administered 2017-12-12: 2.5 [IU]/h via INTRAVENOUS
  Filled 2017-12-12: qty 1000

## 2017-12-12 MED ORDER — ONDANSETRON HCL 4 MG/2ML IJ SOLN: 4.0000 mg | INTRAMUSCULAR | Status: DC | PRN

## 2017-12-12 MED ORDER — LIDOCAINE HCL (PF) 1 % IJ SOLN
30.0000 mL | INTRAMUSCULAR | Status: DC | PRN
  Filled 2017-12-12: qty 30

## 2017-12-12 MED ORDER — SIMETHICONE 80 MG PO CHEW
80.0000 mg | CHEWABLE_TABLET | ORAL | Status: DC | PRN
Start: 2017-12-12 — End: 2017-12-13

## 2017-12-12 MED ORDER — BENZOCAINE-MENTHOL 20-0.5 % EX AERO
1.0000 "application " | INHALATION_SPRAY | CUTANEOUS | Status: DC | PRN
  Administered 2017-12-12: 1 via TOPICAL
  Filled 2017-12-12: qty 56

## 2017-12-12 MED ORDER — TETANUS-DIPHTH-ACELL PERTUSSIS 5-2.5-18.5 LF-MCG/0.5 IM SUSP: 0.5000 mL | Freq: Once | INTRAMUSCULAR | Status: DC

## 2017-12-12 MED ORDER — SOD CITRATE-CITRIC ACID 500-334 MG/5ML PO SOLN: 30.0000 mL | ORAL | Status: DC | PRN

## 2017-12-12 MED ORDER — OXYCODONE-ACETAMINOPHEN 5-325 MG PO TABS: 2.0000 | ORAL_TABLET | ORAL | Status: DC | PRN

## 2017-12-12 MED ORDER — LACTATED RINGERS IV SOLN: 500.0000 mL | INTRAVENOUS | Status: DC | PRN

## 2017-12-12 MED ORDER — ZOLPIDEM TARTRATE 5 MG PO TABS: 5.0000 mg | ORAL_TABLET | Freq: Every evening | ORAL | Status: DC | PRN

## 2017-12-12 MED ORDER — OXYTOCIN BOLUS FROM INFUSION
500.0000 mL | Freq: Once | INTRAVENOUS | Status: AC
  Administered 2017-12-12: 500 mL via INTRAVENOUS

## 2017-12-12 MED ORDER — WITCH HAZEL-GLYCERIN EX PADS: 1.0000 "application " | MEDICATED_PAD | CUTANEOUS | Status: DC | PRN

## 2017-12-12 MED ORDER — COCONUT OIL OIL
1.0000 "application " | TOPICAL_OIL | Status: DC | PRN
  Administered 2017-12-12: 1 via TOPICAL
  Filled 2017-12-12: qty 120

## 2017-12-12 MED ORDER — OXYCODONE-ACETAMINOPHEN 5-325 MG PO TABS: 1.0000 | ORAL_TABLET | ORAL | Status: DC | PRN

## 2017-12-12 MED ORDER — SENNOSIDES-DOCUSATE SODIUM 8.6-50 MG PO TABS
2.0000 | ORAL_TABLET | ORAL | Status: DC
  Filled 2017-12-12: qty 2

## 2017-12-12 MED ORDER — IBUPROFEN 600 MG PO TABS
600.0000 mg | ORAL_TABLET | Freq: Four times a day (QID) | ORAL | Status: DC
  Administered 2017-12-12 – 2017-12-13 (×6): 600 mg via ORAL
  Filled 2017-12-12 (×6): qty 1

## 2017-12-12 MED ORDER — LACTATED RINGERS IV SOLN
INTRAVENOUS | Status: DC
  Administered 2017-12-12: 02:00:00 via INTRAVENOUS

## 2017-12-12 MED ORDER — ONDANSETRON HCL 4 MG PO TABS: 4.0000 mg | ORAL_TABLET | ORAL | Status: DC | PRN

## 2017-12-12 MED ORDER — DIBUCAINE 1 % RE OINT: 1.0000 "application " | TOPICAL_OINTMENT | RECTAL | Status: DC | PRN

## 2017-12-12 MED ORDER — ACETAMINOPHEN 325 MG PO TABS: 650.0000 mg | ORAL_TABLET | ORAL | Status: DC | PRN

## 2017-12-12 MED ORDER — DIPHENHYDRAMINE HCL 25 MG PO CAPS: 25.0000 mg | ORAL_CAPSULE | Freq: Four times a day (QID) | ORAL | Status: DC | PRN

## 2017-12-12 NOTE — MAU Note (Signed)
Pt presents to MAU with complaint so ROM at 0042 this am clear fluid. + FM

## 2017-12-12 NOTE — Progress Notes (Addendum)
Subjective: Postpartum Day 0: Vaginal delivery, no laceration Patient up ad lib, reports no syncope or dizziness. Feeding:  Breast Contraceptive plan:  Undecided--may want to "give her body a rest for awhile", but would take precautions to avoid pregnancy.  Objective: Vital signs in last 24 hours: Temp:  [97.5 F (36.4 C)] 97.5 F (36.4 C) (04/29 0525) Pulse Rate:  [61-86] 61 (04/29 0630) Resp:  [16-18] 18 (04/29 0630) BP: (108-129)/(60-76) 115/61 (04/29 0630) SpO2:  [99 %] 99 % (04/29 0525) Weight:  [77.6 kg (171 lb)] 77.6 kg (171 lb) (04/29 0242)   Vitals:   12/12/17 0431 12/12/17 0446 12/12/17 0525 12/12/17 0630  BP: 121/73 125/64 129/66 115/61  Pulse: 81 72 67 61  Resp:  Temp:   (!) 97.5 F (36.4 C)   TempSrc:   Oral   SpO2:   99%   Weight:      Height:        Physical Exam:  General: alert Lochia: appropriate Uterine Fundus: firm Perineum: Intact DVT Evaluation: No evidence of DVT seen on physical exam. Negative Homan's sign.   CBC Latest Ref Rng & Units 12/12/2017 12/12/2017 06/16/2017  WBC 4.0 - 10.5 K/uL 21.5(H) 13.1(H) 11.4(H)  Hemoglobin 12.0 - 15.0 g/dL 16.1 09.6 04.5  Hematocrit 36.0 - 46.0 % 36.0 39.5 34.0(L)  Platelets 150 - 400 K/uL 188 196 190     Assessment/Plan: Status post vaginal delivery day 0. Stable Leukocytosis Continue current care. Recheck CBC/diff in am Consider d/c tomorrow if desired. Plans outpatient circumcision at [facility].    Danielle Cherry 12/12/2017, 7:21 AM

## 2017-12-12 NOTE — Lactation Note (Addendum)
This note was copied from a baby's chart. Lactation Consultation Note  Patient Name: Danielle Cherry [id number] Date: 12/12/2017 Reason for consult: Initial assessment;Nipple pain/trauma;Term   Initial assessment with Exp BF mom of 11 hour old infant. Infant sleeping next to mom. Infant with 1 BF for 20 minutes, BF Attempts x 3, bottle of formula x 1 of 15 cc, 1 void and 2 stools in the last 24 hours. Infant weight 7 pounds 10.2 ounces. LATCH Scores 4-10.   Mom reports she really wants to BF this infant. She is having difficulty getting infant latched deeply and having pain with feedings. She reports she had difficulty with sore nipples with her 2 older children.  Mom has DEBP set up and had not pumped yet. Discussed supply and demand, colostrum and milk coming to volume. Mom reports she is able to hand express colostrum. Mom has comfort gels and has not applied them. Enc mom to apply EBM to nipples and then comfort gels after BF.   Enc mom to feed infant STS 8-12 x in 24 hours at first feeding cues. Enc mom to call out for feeding assistance with next feeding.   Mom reports all questions/concerns have been answered at this time.   BF Resources handout and LC Brochure given, mom informed of IP/OP Services, BF Support Groups and LC phone #. Mom has manual pump for home use.    Maternal Data Formula Feeding for Exclusion: Yes Reason for exclusion: Mother's choice to formula and breast feed on admission Has patient been taught Hand Expression?: Yes Does the patient have breastfeeding experience prior to this delivery?: Yes  Feeding Feeding Type: Breast Fed Nipple Type: Slow - flow  LATCH Score                   Interventions Interventions: Breast feeding basics reviewed;Support pillows;Skin to skin;Hand express  Lactation Tools Discussed/Used Tools: Pump;Comfort gels;Coconut oil Breast pump type: Double-Electric Breast Pump WIC Program: No Pump Review: Setup, frequency, and  cleaning Initiated by:: Reviewed and encouraged about 8 x a day Date initiated:: 12/12/17   Consult Status Consult Status: Follow-up Date: 12/13/17 Follow-up type: In-patient    [name] 12/12/2017, 2:37 PM

## 2017-12-12 NOTE — H&P (Signed)
Danielle Cherry is a 32 y.o. female, [id number] at 39.2 weeks, presenting for SROM clear fluid at 0042.  FM +  Prenatal hx remarkable for depression.  Tried Zoloft for one week.  Mood stable.  Pt desires PPBTL.  Consent signed.  Patient Active Problem List   Diagnosis Date Noted  . Normal pregnancy 03/17/2011  . Active labor 03/17/2011  . Normal delivery 03/17/2011    History of present pregnancy: Patient entered care at 11.2 weeks.   EDC of 12/17/2017 was established by first trimester [place].   Anatomy scan: 2o weeks, with normal findings and an posterior placenta.   Additional [place] evaluations:  At 24 weeks for completion of heart views. Significant prenatal events:  None  Last evaluation:  This week  OB History    Gravida  4   Para  2   Term  2   Preterm      AB  1   Living  2     SAB  1   TAB      Ectopic      Multiple      Live Births  2        Obstetric Comments  Bled after delivery of daughter, "had to get a shot"       Past Medical History:  Diagnosis Date  . Depression    took meds briefly, going to therapy, doing good  . No pertinent past medical history    Past Surgical History:  Procedure Laterality Date  . NO PAST SURGERIES     Family History: family history includes Cancer in her paternal grandfather; Diabetes in her mother; Hyperlipidemia in her father; Hypertension in her mother; Parkinson's disease in her paternal grandmother. Social History:  reports that she has quit smoking. Her smoking use included cigarettes. She has never used smokeless tobacco. She reports that she drinks about 0.6 oz of alcohol per week. She reports that she does not use drugs.   Prenatal Transfer Tool  Maternal Diabetes: No Genetic Screening: Normal Maternal Ultrasounds/Referrals: Normal Fetal Ultrasounds or other Referrals:  None Maternal Substance Abuse:  No Significant Maternal Medications:  None Significant Maternal Lab Results: None  TDAP Yes Flu  Declined  ROS: All 10 systems viewed and negative except as stated above.  Allergies  Allergen Reactions  . Strawberry Extract Anaphylaxis     Dilation: 7 Effacement (%): 80 Station: -1 Exam by:: Trish Mage Blood pressure 125/76, pulse 80, resp. rate 18, last menstrual period 03/12/2017.  Chest clear Heart RRR without murmur Abd gravid, NT, FH appropriate Pelvic: Adequate.  SVE per RN Ext: +2/+2 no edema  FHR: Category 1 UCs:  4 in 10 minutes  Prenatal labs: ABO, Rh:  A pos Antibody:  Neg Rubella:   Imm RPR:   NR HBsAg:   Neg HIV:   NR GBS:   Sickle cell/Hgb electrophoresis:  AA Pap:   Neg GC:  Neg Chlamydia:  Neg Genetic screenings:  Normal Glucola:  97 Other:   Hgb 12.1 at NOB, 11.7 at 28 weeks       Assessment/Plan: IUP at 39.2 with SROM clear fluid Cat 1 strip  Plan: Admit to Birthing Suite  Routine [facility] orders Pain med/epidural prn Pt declines tubal ligation but would like circumcision done in hospital.  Henderson Newcomer ProtheroCNM, MSN 12/12/2017, 2:24 AM

## 2017-12-13 LAB — CBC WITH DIFFERENTIAL/PLATELET
BASOS ABS: 0 10*3/uL (ref 0.0–0.1)
BASOS PCT: 0 %
EOS PCT: 1 %
Eosinophils Absolute: 0.2 10*3/uL (ref 0.0–0.7)
HCT: 34.7 % — ABNORMAL LOW (ref 36.0–46.0)
Hemoglobin: 11.4 g/dL — ABNORMAL LOW (ref 12.0–15.0)
LYMPHS PCT: 25 %
Lymphs Abs: 3.9 10*3/uL (ref 0.7–4.0)
MCH: 29.9 pg (ref 26.0–34.0)
MCHC: 32.9 g/dL (ref 30.0–36.0)
MCV: 91.1 fL (ref 78.0–100.0)
MONO ABS: 0.6 10*3/uL (ref 0.1–1.0)
Monocytes Relative: 4 %
Neutro Abs: 11 10*3/uL — ABNORMAL HIGH (ref 1.7–7.7)
Neutrophils Relative %: 70 %
PLATELETS: 173 10*3/uL (ref 150–400)
RBC: 3.81 MIL/uL — AB (ref 3.87–5.11)
RDW: 14.4 % (ref 11.5–15.5)
WBC: 15.6 10*3/uL — AB (ref 4.0–10.5)

## 2017-12-13 MED ORDER — IBUPROFEN 600 MG PO TABS: 600.0000 mg | ORAL_TABLET | Freq: Four times a day (QID) | ORAL | 0 refills | Status: DC

## 2017-12-13 NOTE — Discharge Summary (Signed)
OB Discharge Summary     Patient Name: Danielle Cherry DOB: 1986/01/08 MRN: [id number]  Date of admission: 12/12/2017 Delivering MD: Kenney Houseman   Date of discharge: 12/13/2017  Admitting diagnosis: 39 WEEKS ROM CTX Intrauterine pregnancy: [redacted]w[redacted]d     Secondary diagnosis:  Active Problems:   Depression  Additional problems: none     Discharge diagnosis: Term Pregnancy Delivered                                                                                                Post partum procedures:  Augmentation:   Complications: None  Hospital course:  Onset of Labor With Vaginal Delivery     32 y.o. yo [phone] at [redacted]w[redacted]d was admitted in Active Labor on 12/12/2017. Patient had an uncomplicated labor course as follows:  Membrane Rupture Time/Date: 12:42 AM ,12/12/2017   Intrapartum Procedures: Episiotomy: None [1]                                         Lacerations:  None [1]  Patient had a delivery of a Viable infant. 12/12/2017  Information for the patient's newborn:  Anisha, Starliper [[id number]]  Delivery Method: Vaginal, Spontaneous(Filed from Delivery Summary)    Pateint had an uncomplicated postpartum course.  She is ambulating, tolerating a regular diet, passing flatus, and urinating well. Patient is discharged home in stable condition on 12/13/17.   Physical exam  Vitals:   12/12/17 0630 12/12/17 0941 12/12/17 1818 12/13/17 0500  BP: 115/61 103/66 109/67 110/75  Pulse: 61 79 82 74  Resp: Temp:  98.6 F (37 C) (!) 97.5 F (36.4 C) 97.9 F (36.6 C)  TempSrc:  Oral Oral Oral  SpO2:   98%   Weight:      Height:       General: alert, cooperative and no distress Lochia: appropriate Uterine Fundus: firm Incision:  DVT Evaluation: No evidence of DVT seen on physical exam. Labs: Lab Results  Component Value Date   WBC 15.6 (H) 12/13/2017   HGB 11.4 (L) 12/13/2017   HCT 34.7 (L) 12/13/2017   MCV 91.1 12/13/2017   PLT 173 12/13/2017   CMP Latest  Ref Rng & Units 05/23/2017  Glucose 65 - 99 mg/dL 130(Q)  BUN 6 - 20 mg/dL 5(L)  Creatinine 6.57 - 1.00 mg/dL 8.46  Sodium 962 - 952 mmol/L 137  Potassium 3.5 - 5.1 mmol/L 3.6  Chloride 101 - 111 mmol/L 106  CO2 22 - 32 mmol/L 24  Calcium 8.9 - 10.3 mg/dL 9.0  Total Protein 6.0 - 8.3 g/dL -  Total Bilirubin 0.3 - 1.2 mg/dL -  Alkaline Phos 39 - 841 U/L -  AST 0 - 37 U/L -  ALT 0 - 35 U/L -    Discharge instruction: per After Visit Summary and "Baby and Me Booklet".  After visit meds:  Allergies as of 12/13/2017      Reactions   Strawberry Extract Anaphylaxis  Medication List    TAKE these medications   calcium carbonate 500 MG chewable tablet Commonly known as:  TUMS - dosed in mg elemental calcium Chew 2-3 tablets by mouth 3 (three) times daily as needed for indigestion or heartburn.   flintstones complete 60 MG chewable tablet Chew 2 tablets by mouth daily.   ibuprofen 600 MG tablet Commonly known as:  ADVIL,MOTRIN Take 1 tablet (600 mg total) by mouth every 6 (six) hours.   ranitidine 150 MG tablet Commonly known as:  ZANTAC Take 150 mg by mouth at bedtime.       Diet: routine diet  Activity: Advance as tolerated. Pelvic rest for 6 weeks.   Outpatient follow up:6 weeks Follow up Visit: 6 weeks Postpartum contraception: Undecided  Newborn Data: Live born female  Birth Weight: 7 lb 10.2 oz (3465 g) APGAR: 9, 10  Newborn Delivery   Birth date/time:  12/12/2017 02:53:00 Delivery type:  Vaginal, Spontaneous     Baby Feeding: Breast Disposition:home with mother Call office today to schedule appointment for outpatient circumcision  12/13/2017 Rhea Pink, CNM

## 2017-12-13 NOTE — Progress Notes (Signed)
CSW received consult due to score on question 10 on [place] Depression Screen.    When CSW arrived MOB was resting in bed, FOB was resting on the couch, and infant was asleep in bassinet. CSW explained CSW's role and MOB gave CSW permission to complete the assessment while FOB was present. MOB was polite, easy to engage, and receptive to meeting with CSW.   CSW inquired about MOB's response regarding the [place] Depression Scale (question 10).  MOB reported that MOB misread the assessment and was not aware that the assessment was inquiring MOB's thoughts and feelings over the past 7 days. MOB communicated MOB was not feeling like she wanted to harm herself and was adamant about misreading the question.  CSW assessed for safety and MOB denied SI and HI.    MOB did report a hx of depression and anxiety and reported that her MH hx was situational.  MOB shared that MOB and FOB were having marital problems and has since engaged in family counseling.  MOB denied having any current signs or symptoms of anxiety/depression.   CSW provided education regarding Baby Blues vs PMADs. CSW encouraged MOB to evaluate her mental health throughout the postpartum period with the use of the New Mom Checklist developed by Postpartum Progress and notify a medical professional if symptoms arise.  MOB reported having a good support team and feeling prepared to parent.   There are no barriers to discharge.   Blaine Hamper, MSW, LCSW Clinical Social Work [phone]

## 2017-12-13 NOTE — Lactation Note (Signed)
This note was copied from a baby's chart. Lactation Consultation Note  Patient Name: Boy Reha Martinovich [id number] Date: 12/13/2017 Reason for consult: Follow-up assessment   Baby has been frequently formula feeding. Mother concerned about her milk supply. Discussed supply and demand and how milk comes to volume. Suggest breastfeeding before offering formula. Pacifier use not recommended at this time.  Reviewed engorgement care and monitoring voids/stools. Mom encouraged to feed baby 8-12 times/24 hours and with feeding cues.     Maternal Data    Feeding    LATCH Score                   Interventions    Lactation Tools Discussed/Used     Consult Status Consult Status: Complete    Hardie Pulley 12/13/2017, 10:18 AM

## 2017-12-13 NOTE — Discharge Instructions (Signed)
Breast Pumping Tips If you are breastfeeding, there may be times when you cannot feed your baby directly. Returning to work or going on a trip are examples. Pumping allows you to store breast milk and feed it to your baby later. You may not get much milk when you first start to pump. Your breasts should start to make more after a few days. If you pump at the times you usually feed your baby, you may be able to keep making enough milk to feed your baby without also using formula. The more often you pump, the more milk your body will make. When should I pump?  You can start to pump soon after you have your baby. Ask your doctor what is right for you and your baby.  If you are going back to work, start pumping a few weeks before. This gives you time to learn how to pump and to store a supply of milk.  When you are with your baby, feed your baby when he or she is hungry. Pump after each feeding.  When you are away from your baby for many hours, pump for about 15 minutes every 2-3 hours. Pump both breasts at the same time if you can.  If your baby has a formula feeding, make sure to pump close to the same time.  If you drink any alcohol, wait 2 hours before pumping. How do I get ready to pump? Your let-down reflex is your body's natural reaction that makes your breast milk flow. It is easier to make your breast milk flow when you are relaxed. Try these things to help you relax:  Smell one of your baby's blankets or an item of clothing.  Look at a picture or video of your baby.  Sit in a quiet, private space.  Massage the breast you plan to pump.  Place soothing warmth on the breast.  Play relaxing music.  What are some breast pumping tips?  Wash your hands before you pump. You do not need to wash your nipples or breasts.  There are three ways to pump. You can: ? Use your hand to massage and squeeze your breast. ? Use a handheld manual pump. ? Use an electric pump.  Make sure the  suction cup on the breast pump is the right size. Place the suction cup directly over the nipple. It can be painful or hurt your nipple if it is the wrong size or placed wrong.  Put a small amount of purified or modified lanolin on your nipple and areola if you are sore.  If you are using an electric pump, change the speed and suction power to be more comfortable.  You may need a different type of pump if pumping hurts or you do not get a lot of milk. Your doctor can help you pick what type of pump to use.  Keep a full water bottle near you always. Drinking lots of fluid helps you make more milk.  You can store your milk to use later. Pumped breast milk can be stored in a sealable, sterile container or plastic bag. Always put the date you pumped it on the container. ? Milk can stay out at room temperature for up to 8 hours. ? You can store your milk in the refrigerator for up to 8 days. ? You can store your milk in the freezer for 3 months. Thaw frozen milk using warm water. Do not put it in the microwave.  Do not smoke. Ask  your doctor for help. When should I call my doctor?  You have a hard time pumping.  You are worried you do not make enough milk.  You have nipple pain, soreness, or redness.  You want to take birth control pills. This information is not intended to replace advice given to you by your health care provider. Make sure you discuss any questions you have with your health care provider. Document Released: 01/19/2008 Document Revised: 01/08/2016 Document Reviewed: 05/25/2013 Elsevier Interactive Patient Education  2017 [employer] Postpartum Depression and Baby Blues The postpartum period begins right after the birth of a baby. During this time, there is often a great amount of joy and excitement. It is also a time of many changes in the life of the parents. Regardless of how many times a mother gives birth, each child brings new challenges and dynamics to the family. It  is not unusual to have feelings of excitement along with confusing shifts in moods, emotions, and thoughts. All mothers are at risk of developing postpartum depression or the "baby blues." These mood changes can occur right after giving birth, or they may occur many months after giving birth. The baby blues or postpartum depression can be mild or severe. Additionally, postpartum depression can go away rather quickly, or it can be a long-term condition. What are the causes? Raised hormone levels and the rapid drop in those levels are thought to be a main cause of postpartum depression and the baby blues. A number of hormones change during and after pregnancy. Estrogen and progesterone usually decrease right after the delivery of your baby. The levels of thyroid hormone and various cortisol steroids also rapidly drop. Other factors that play a role in these mood changes include major life events and genetics. What increases the risk? If you have any of the following risks for the baby blues or postpartum depression, know what symptoms to watch out for during the postpartum period. Risk factors that may increase the likelihood of getting the baby blues or postpartum depression include:  Having a personal or family history of depression.  Having depression while being pregnant.  Having premenstrual mood issues or mood issues related to oral contraceptives.  Having a lot of life stress.  Having marital conflict.  Lacking a social support network.  Having a baby with special needs.  Having health problems, such as diabetes.  What are the signs or symptoms? Symptoms of baby blues include:  Brief changes in mood, such as going from extreme happiness to sadness.  Decreased concentration.  Difficulty sleeping.  Crying spells, tearfulness.  Irritability.  Anxiety.  Symptoms of postpartum depression typically begin within the first month after giving birth. These symptoms  include:  Difficulty sleeping or excessive sleepiness.  Marked weight loss.  Agitation.  Feelings of worthlessness.  Lack of interest in activity or food.  Postpartum psychosis is a very serious condition and can be dangerous. Fortunately, it is rare. Displaying any of the following symptoms is cause for immediate medical attention. Symptoms of postpartum psychosis include:  Hallucinations and delusions.  Bizarre or disorganized behavior.  Confusion or disorientation.  How is this diagnosed? A diagnosis is made by an evaluation of your symptoms. There are no medical or lab tests that lead to a diagnosis, but there are various questionnaires that a health care provider may use to identify those with the baby blues, postpartum depression, or psychosis. Often, a screening tool called the [place] Postnatal Depression Scale is used to diagnose depression  in the postpartum period. How is this treated? The baby blues usually goes away on its own in 1-2 weeks. Social support is often all that is needed. You will be encouraged to get adequate sleep and rest. Occasionally, you may be given medicines to help you sleep. Postpartum depression requires treatment because it can last several months or longer if it is not treated. Treatment may include individual or group therapy, medicine, or both to address any social, physiological, and psychological factors that may play a role in the depression. Regular exercise, a healthy diet, rest, and social support may also be strongly recommended. Postpartum psychosis is more serious and needs treatment right away. Hospitalization is often needed. Follow these instructions at home:  Get as much rest as you can. Nap when the baby sleeps.  Exercise regularly. Some women find yoga and walking to be beneficial.  Eat a balanced and nourishing diet.  Do little things that you enjoy. Have a cup of tea, take a bubble bath, read your favorite magazine, or listen  to your favorite music.  Avoid alcohol.  Ask for help with household chores, cooking, grocery shopping, or running errands as needed. Do not try to do everything.  Talk to people close to you about how you are feeling. Get support from your partner, family members, friends, or other new moms.  Try to stay positive in how you think. Think about the things you are grateful for.  Do not spend a lot of time alone.  Only take over-the-counter or prescription medicine as directed by your health care provider.  Keep all your postpartum appointments.  Let your health care provider know if you have any concerns. Contact a health care provider if: You are having a reaction to or problems with your medicine. Get help right away if:  You have suicidal feelings.  You think you may harm the baby or someone else. This information is not intended to replace advice given to you by your health care provider. Make sure you discuss any questions you have with your health care provider. Document Released: 05/06/2004 Document Revised: 01/08/2016 Document Reviewed: 05/14/2013 Elsevier Interactive Patient Education  2017 Elsevier Inc. Home Care Instructions for Mom ACTIVITY  Gradually return to your regular activities.  Let yourself rest. Nap while your baby sleeps.  Avoid lifting anything that is heavier than 10 lb (4.5 kg) until your health care provider says it is okay.  Avoid activities that take a lot of effort and energy (are strenuous) until approved by your health care provider. Walking at a slow-to-moderate pace is usually safe.  If you had a cesarean delivery: ? Do not vacuum, climb stairs, or drive a car for 4-6 weeks. ? Have someone help you at home until you feel like you can do your usual activities yourself. ? Do exercises as told by your health care provider, if this applies.  VAGINAL BLEEDING You may continue to bleed for 4-6 weeks after delivery. Over time, the amount of blood  usually decreases and the color of the blood usually gets lighter. However, the flow of bright red blood may increase if you have been too active. If you need to use more than one pad in an hour because your pad gets soaked, or if you pass a large clot:  Lie down.  Raise your feet.  Place a cold compress on your lower abdomen.  Rest.  Call your health care provider.  If you are breastfeeding, your period should return anytime between  8 weeks after delivery and the time that you stop breastfeeding. If you are not breastfeeding, your period should return 6-8 weeks after delivery. PERINEAL CARE The perineal area, or perineum, is the part of your body between your thighs. After delivery, this area needs special care. Follow these instructions as told by your health care provider.  Take warm tub baths for 15-20 minutes.  Use medicated pads and pain-relieving sprays and creams as told.  Do not use tampons or douches until vaginal bleeding has stopped.  Each time you go to the bathroom: ? Use a peri bottle. ? Change your pad. ? Use towelettes in place of toilet paper until your stitches have healed.  Do Kegel exercises every day. Kegel exercises help to maintain the muscles that support the vagina, bladder, and bowels. You can do these exercises while you are standing, sitting, or lying down. To do Kegel exercises: ? Tighten the muscles of your abdomen and the muscles that surround your birth canal. ? Hold for a few seconds. ? Relax. ? Repeat until you have done this 5 times in a row.  To prevent hemorrhoids from developing or getting worse: ? Drink enough fluid to keep your urine clear or pale yellow. ? Avoid straining when having a bowel movement. ? Take over-the-counter medicines and stool softeners as told by your health care provider.  BREAST CARE  Wear a tight-fitting bra.  Avoid taking over-the-counter pain medicine for breast discomfort.  Apply ice to the breasts to help  with discomfort as needed: ? Put ice in a plastic bag. ? Place a towel between your skin and the bag. ? Leave the ice on for 20 minutes or as told by your health care provider.  NUTRITION  Eat a well-balanced diet.  Do not try to lose weight quickly by cutting back on calories.  Take your prenatal vitamins until your postpartum checkup or until your health care provider tells you to stop.  POSTPARTUM DEPRESSION You may find yourself crying for no apparent reason and unable to cope with all of the changes that come with having a newborn. This mood is called postpartum depression. Postpartum depression happens because your hormone levels change after delivery. If you have postpartum depression, get support from your partner, friends, and family. If the depression does not go away on its own after several weeks, contact your health care provider. BREAST SELF-EXAM Do a breast self-exam each month, at the same time of the month. If you are breastfeeding, check your breasts just after a feeding, when your breasts are less full. If you are breastfeeding and your period has started, check your breasts on day 5, 6, or 7 of your period. Report any lumps, bumps, or discharge to your health care provider. Know that breasts are normally lumpy if you are breastfeeding. This is temporary, and it is not a health risk. INTIMACY AND SEXUALITY Avoid sexual activity for at least 3-4 weeks after delivery or until the brownish-red vaginal flow is completely gone. If you want to avoid pregnancy, use some form of birth control. You can get pregnant after delivery, even if you have not had your period. SEEK MEDICAL CARE IF:  You feel unable to cope with the changes that a child brings to your life, and these feelings do not go away after several weeks.  You notice a lump, a bump, or discharge on your breast.  SEEK IMMEDIATE MEDICAL CARE IF:  Blood soaks your pad in 1 hour or less.  You have: ? Severe pain or  cramping in your lower abdomen. ? A bad-smelling vaginal discharge. ? A fever that is not controlled by medicine. ? A fever, and an area of your breast is red and sore. ? Pain or redness in your calf. ? Sudden, severe chest pain. ? Shortness of breath. ? Painful or bloody urination. ? Problems with your vision.  You vomit for 12 hours or longer.  You develop a severe headache.  You have serious thoughts about hurting yourself, your child, or anyone else.  This information is not intended to replace advice given to you by your health care provider. Make sure you discuss any questions you have with your health care provider. Document Released: 07/30/2000 Document Revised: 01/08/2016 Document Reviewed: 02/03/2015 Elsevier Interactive Patient Education  2017 [employer].

## 2018-09-26 ENCOUNTER — Encounter (HOSPITAL_COMMUNITY): Payer: Self-pay

## 2018-09-26 ENCOUNTER — Ambulatory Visit (HOSPITAL_COMMUNITY)
Admission: EM | Admit: 2018-09-26 | Discharge: 2018-09-26 | Disposition: A | Discharge status: Home / Self Care | Payer: Self-pay | Attending: Family Medicine | Admitting: Family Medicine

## 2018-09-26 ENCOUNTER — Other Ambulatory Visit: Payer: Self-pay

## 2018-09-26 DIAGNOSIS — J069 Acute upper respiratory infection, unspecified: Secondary | ICD-10-CM | POA: Insufficient documentation

## 2018-09-26 DIAGNOSIS — J029 Acute pharyngitis, unspecified: Secondary | ICD-10-CM | POA: Insufficient documentation

## 2018-09-26 DIAGNOSIS — B9789 Other viral agents as the cause of diseases classified elsewhere: Secondary | ICD-10-CM | POA: Insufficient documentation

## 2018-09-26 LAB — POCT RAPID STREP A: Streptococcus, Group A Screen (Direct): NEGATIVE

## 2018-09-26 NOTE — ED Triage Notes (Signed)
Pt cc cough and sore throat this started today.

## 2018-09-26 NOTE — ED Provider Notes (Signed)
[facility]   [id number] 09/26/18 Arrival Time: 0809  ASSESSMENT & PLAN:  1. Viral URI with cough   2. Sore throat    Results for orders placed or performed during the hospital encounter of 09/26/18  POCT rapid strep A [facility])  Result Value Ref Range   Streptococcus, Group A Screen (Direct) NEGATIVE NEGATIVE   Throat culture sent.  OTC analgesics and throat care as needed. Discussed likely viral etiology. Husband with the same.   Discharge Instructions      You may use over the counter ibuprofen or acetaminophen as needed.   For a sore throat, over the counter products such as [employer] Peroxyl Mouth Sore Rinse or Chloraseptic Sore Throat Spray may provide some temporary relief.  Your rapid strep test was negative today. We have sent your throat swab for culture and will let you know of any positive results.   Reviewed expectations re: course of current medical issues. Questions answered. Outlined signs and symptoms indicating need for more acute intervention. Patient verbalized understanding. After Visit Summary given.   SUBJECTIVE:  Danielle Cherry is a 33 y.o. female who reports a sore throat and dry cough; mild nasal congestion. Onset abrupt beginning 1-2 days ago. Cough is non-productive. No SOB/wheezing. Normal PO intake but reports discomfort with swallowing. Fever reported: no. No neck pain or swelling. No associated n/v/abdominal symptoms. Husband with the same.  OTC treatment: none reported.  ROS: As per HPI.   OBJECTIVE:  Vitals:   09/26/18 0842 09/26/18 0902  BP: 123/85   Pulse: 87   Resp: 18   Temp: 97.8 F (36.6 C)   TempSrc: Oral   SpO2: 98%   Weight:  68.5 kg     General appearance: alert; no distress HEENT: throat with mild erythema and cobblestoning; uvula midline: yes Neck: supple with FROM; no lymphadenopathy CV: RRR Lungs: clear to auscultation bilaterally; mild dry cough; no wheezing Abd: soft; non-tender Skin:  reveals no rash; warm and dry Psychological: alert and cooperative; normal mood and affect  Allergies  Allergen Reactions  . Strawberry Extract Anaphylaxis    Past Medical History:  Diagnosis Date  . Depression    took meds briefly, going to therapy, doing good  . No pertinent past medical history    Social History   Socioeconomic History  . Marital status: Married    Spouse name: Not on file  . Number of children: Not on file  . Years of education: Not on file  . Highest education level: Not on file  Occupational History  . Not on file  Social Needs  . Financial resource strain: Not on file  . Food insecurity:    Worry: Not on file    Inability: Not on file  . Transportation needs:    Medical: Not on file    Non-medical: Not on file  Tobacco Use  . Smoking status: Former Smoker    Types: Cigarettes  . Smokeless tobacco: Never Used  . Tobacco comment: quit 2009  Substance and Sexual Activity  . Alcohol use: Yes    Alcohol/week: 1.0 standard drinks    Types: 1 Glasses of wine per week    Comment: social, not with preg  . Drug use: No  . Sexual activity: Yes  Lifestyle  . Physical activity:    Days per week: Not on file    Minutes per session: Not on file  . Stress: Not on file  Relationships  . Social connections:  Talks on phone: Not on file    Gets together: Not on file    Attends religious service: Not on file    Active member of club or organization: Not on file    Attends meetings of clubs or organizations: Not on file    Relationship status: Not on file  . Intimate partner violence:    Fear of current or ex partner: Not on file    Emotionally abused: Not on file    Physically abused: Not on file    Forced sexual activity: Not on file  Other Topics Concern  . Not on file  Social History Narrative  . Not on file   Family History  Problem Relation Age of Onset  . Diabetes Mother   . Hypertension Mother   . Hyperlipidemia Father   . Cancer  Paternal Grandfather        lung  . Parkinson's disease Paternal Dulce Sellar, MD 09/26/18 [phone]

## 2018-09-26 NOTE — Discharge Instructions (Addendum)
You may use over the counter ibuprofen or acetaminophen as needed.  For a sore throat, over the counter products such as [employer] Peroxyl Mouth Sore Rinse or Chloraseptic Sore Throat Spray may provide some temporary relief. Your rapid strep test was negative today. We have sent your throat swab for culture and will let you know of any positive results. 

## 2018-09-28 LAB — CULTURE, GROUP A STREP (THRC)

## 2019-07-05 ENCOUNTER — Other Ambulatory Visit (HOSPITAL_COMMUNITY)
Admission: RE | Admit: 2019-07-05 | Discharge: 2019-07-05 | Disposition: A | Discharge status: Home / Self Care | Payer: Medicaid Other | Source: Ambulatory Visit | Attending: Obstetrics and Gynecology | Admitting: Obstetrics and Gynecology

## 2019-07-05 ENCOUNTER — Encounter (HOSPITAL_BASED_OUTPATIENT_CLINIC_OR_DEPARTMENT_OTHER): Payer: Self-pay | Admitting: *Deleted

## 2019-07-05 ENCOUNTER — Other Ambulatory Visit: Payer: Self-pay

## 2019-07-05 ENCOUNTER — Other Ambulatory Visit: Payer: Self-pay | Admitting: Obstetrics and Gynecology

## 2019-07-05 DIAGNOSIS — O021 Missed abortion: Secondary | ICD-10-CM

## 2019-07-05 DIAGNOSIS — Z01812 Encounter for preprocedural laboratory examination: Secondary | ICD-10-CM | POA: Insufficient documentation

## 2019-07-05 DIAGNOSIS — Z20828 Contact with and (suspected) exposure to other viral communicable diseases: Secondary | ICD-10-CM | POA: Diagnosis not present

## 2019-07-05 LAB — SARS CORONAVIRUS 2 (TAT 6-24 HRS): SARS Coronavirus 2: NEGATIVE

## 2019-07-05 NOTE — H&P (Signed)
Danielle Cherry is a 33 y.o. female, [id number] at 68 3/7 weeks by dates, 10 1/7 weeks by size, presenting for D&E on 07/06/19, due to IUFD noted on [place] yesterday in office.  Patient had viable pregnancy noted on [place] 06/12/19, at 10 1/[redacted] weeks gestation, normal prenatal lab, and no issues.  Hx 3 prior SVBs with [facility].  A+ blood type noted.  Presented to office yesterday for NOB w/u, with report of concern regarding fetal viability--"didn't feel pregnancy symptoms anymore".  Unable to auscultate FHR, US showed IUFD measuring at 10 1/7 weeks.  Options for management were reviewed, and patient and her husband have elected to proceed with D&E. R&B were reviewed, including bleeding, infection, damage to other organs--they seem to understand these issues and wish to proceed.  COVID testing was done 07/05/19.    Patient Active Problem List   Diagnosis Date Noted  . Missed abortion 07/05/2019  . Depression 12/12/2017    OB History    Gravida  5   Para  3   Term  3   Preterm      AB  1   Living  3     SAB  1   TAB      Ectopic      Multiple  0   Live Births  3        Obstetric Comments  Bled after delivery of daughter, "had to get a shot"      2007--SAB 2011--SVB 2012 SVB 2019--SVB, with heavier bleeding after delivery, but did not require transfusion.  Past Medical History:  Diagnosis Date  . Depression    took meds briefly, going to therapy, doing good  . No pertinent past medical history    Past Surgical History:  Procedure Laterality Date  . NO PAST SURGERIES     Family History: family history includes Cancer in her paternal grandfather; Diabetes in her mother; Hyperlipidemia in her father; Hypertension in her mother; Parkinson's disease in her paternal grandmother.   Social History:  reports that she has quit smoking. Her smoking use included cigarettes. She has never used smokeless tobacco. She reports current alcohol use of about 1.0 standard drinks of alcohol per  week. She reports that she does not use drugs.  Patient is Hispanic/white, 2 years of college, employed as Art therapist, married to [place], who is involved and supportive.   ROS:  Denies bleeding, cramping, nausea/vomiting, HA, or any other sx.  Allergies  Allergen Reactions  . Strawberry Extract Anaphylaxis  . Latex Itching       Height 4\' 11"  (1.499 m), weight 69.9 kg, last menstrual period 04/02/2019, not currently breastfeeding.  Chest clear Heart RRR without murmur Abd gravid, NT, FH approx 10-11 week size Pelvic: Cervix closed, NT Ext: WNL  Prenatal labs: ABO, Rh:  A+ Antibody:  Neg Rubella:   NR RPR:  Immune HBsAg:   NR HIV:   NR Sickle cell/Hgb electrophoresis:  AA Pap:  WNL 06/12/19 GC:  Neg 06/12/19 Chlamydia:  Neg 06/12/19 Other:   Hgb 12.9 at NOB      Assessment/Plan: Missed AB at 13 weeks by dates, 10 3/7 weeks by size A+ blood type Desires suction D&E  Plan: Outpatient D&E on 07/06/19 per consult with Dr. Charlesetta Garibaldi Routine [facility] pre-orders COVID testing done 07/05/19 Support to patient for loss. F/u in office per d/c plan.  Donnel Saxon CNM, [place] 07/05/2019, 2:23 PM

## 2019-07-06 ENCOUNTER — Ambulatory Visit (HOSPITAL_BASED_OUTPATIENT_CLINIC_OR_DEPARTMENT_OTHER)
Admission: RE | Admit: 2019-07-06 | Discharge: 2019-07-06 | Disposition: A | Discharge status: Home / Self Care | Payer: Medicaid Other | Attending: Obstetrics and Gynecology | Admitting: Obstetrics and Gynecology

## 2019-07-06 ENCOUNTER — Encounter (HOSPITAL_BASED_OUTPATIENT_CLINIC_OR_DEPARTMENT_OTHER)
Admission: RE | Disposition: A | Discharge status: Home / Self Care | Payer: Self-pay | Source: Home / Self Care | Attending: Obstetrics and Gynecology

## 2019-07-06 ENCOUNTER — Ambulatory Visit (HOSPITAL_BASED_OUTPATIENT_CLINIC_OR_DEPARTMENT_OTHER): Payer: Medicaid Other | Admitting: Certified Registered"

## 2019-07-06 ENCOUNTER — Encounter (HOSPITAL_BASED_OUTPATIENT_CLINIC_OR_DEPARTMENT_OTHER): Payer: Self-pay | Admitting: *Deleted

## 2019-07-06 ENCOUNTER — Other Ambulatory Visit: Payer: Self-pay

## 2019-07-06 ENCOUNTER — Ambulatory Visit (HOSPITAL_COMMUNITY)
Admission: RE | Admit: 2019-07-06 | Discharge: 2019-07-06 | Disposition: A | Discharge status: Home / Self Care | Payer: Medicaid Other | Source: Ambulatory Visit | Attending: Obstetrics and Gynecology | Admitting: Obstetrics and Gynecology

## 2019-07-06 ENCOUNTER — Other Ambulatory Visit (HOSPITAL_COMMUNITY): Payer: Self-pay | Admitting: Obstetrics and Gynecology

## 2019-07-06 ENCOUNTER — Other Ambulatory Visit: Payer: Self-pay | Admitting: Obstetrics and Gynecology

## 2019-07-06 DIAGNOSIS — Z87891 Personal history of nicotine dependence: Secondary | ICD-10-CM | POA: Diagnosis not present

## 2019-07-06 DIAGNOSIS — O021 Missed abortion: Secondary | ICD-10-CM

## 2019-07-06 DIAGNOSIS — Z515 Encounter for palliative care: Secondary | ICD-10-CM

## 2019-07-06 DIAGNOSIS — Z3A13 13 weeks gestation of pregnancy: Secondary | ICD-10-CM | POA: Diagnosis not present

## 2019-07-06 HISTORY — PX: OPERATIVE ULTRASOUND: SHX5996

## 2019-07-06 HISTORY — PX: DILATION AND EVACUATION: SHX1459

## 2019-07-06 LAB — CBC
HCT: 38.7 % (ref 36.0–46.0)
Hemoglobin: 12.8 g/dL (ref 12.0–15.0)
MCH: 31.3 pg (ref 26.0–34.0)
MCHC: 33.1 g/dL (ref 30.0–36.0)
MCV: 94.6 fL (ref 80.0–100.0)
Platelets: 228 10*3/uL (ref 150–400)
RBC: 4.09 MIL/uL (ref 3.87–5.11)
RDW: 12.6 % (ref 11.5–15.5)
WBC: 8.6 10*3/uL (ref 4.0–10.5)
nRBC: 0 % (ref 0.0–0.2)

## 2019-07-06 SURGERY — DILATION AND EVACUATION, UTERUS
Anesthesia: General | Site: Vagina

## 2019-07-06 MED ORDER — LIDOCAINE 2% (20 MG/ML) 5 ML SYRINGE
INTRAMUSCULAR | Status: AC
  Filled 2019-07-06: qty 5

## 2019-07-06 MED ORDER — SILVER NITRATE-POT NITRATE 75-25 % EX MISC
CUTANEOUS | Status: AC
  Filled 2019-07-06: qty 10

## 2019-07-06 MED ORDER — OXYCODONE HCL 5 MG/5ML PO SOLN: 5.0000 mg | Freq: Once | ORAL | Status: DC | PRN

## 2019-07-06 MED ORDER — SILVER NITRATE-POT NITRATE 75-25 % EX MISC
CUTANEOUS | Status: DC | PRN
  Administered 2019-07-06: 2

## 2019-07-06 MED ORDER — LIDOCAINE HCL 2 % IJ SOLN
INTRAMUSCULAR | Status: DC | PRN
  Administered 2019-07-06: 10 mL via INTRADERMAL

## 2019-07-06 MED ORDER — HYDROCODONE-ACETAMINOPHEN 5-325 MG PO TABS: 1.0000 | ORAL_TABLET | Freq: Four times a day (QID) | ORAL | 0 refills | Status: DC | PRN

## 2019-07-06 MED ORDER — KETOROLAC TROMETHAMINE 30 MG/ML IJ SOLN
30.0000 mg | Freq: Once | INTRAMUSCULAR | Status: AC | PRN
  Administered 2019-07-06: 30 mg via INTRAVENOUS

## 2019-07-06 MED ORDER — MIDAZOLAM HCL 2 MG/2ML IJ SOLN
INTRAMUSCULAR | Status: AC
  Filled 2019-07-06: qty 2

## 2019-07-06 MED ORDER — SUCCINYLCHOLINE CHLORIDE 20 MG/ML IJ SOLN
INTRAMUSCULAR | Status: DC | PRN
  Administered 2019-07-06: 80 mg via INTRAVENOUS

## 2019-07-06 MED ORDER — LACTATED RINGERS IV SOLN
INTRAVENOUS | Status: DC
  Administered 2019-07-06 (×2): via INTRAVENOUS

## 2019-07-06 MED ORDER — DEXAMETHASONE SODIUM PHOSPHATE 4 MG/ML IJ SOLN
INTRAMUSCULAR | Status: DC | PRN
  Administered 2019-07-06: 10 mg via INTRAVENOUS

## 2019-07-06 MED ORDER — ACETAMINOPHEN 500 MG PO TABS
1000.0000 mg | ORAL_TABLET | Freq: Once | ORAL | Status: AC
  Administered 2019-07-06: 1000 mg via ORAL

## 2019-07-06 MED ORDER — MIDAZOLAM HCL 5 MG/5ML IJ SOLN
INTRAMUSCULAR | Status: DC | PRN
  Administered 2019-07-06: 2 mg via INTRAVENOUS

## 2019-07-06 MED ORDER — HYDROMORPHONE HCL 1 MG/ML IJ SOLN: 0.2500 mg | INTRAMUSCULAR | Status: DC | PRN

## 2019-07-06 MED ORDER — KETOROLAC TROMETHAMINE 30 MG/ML IJ SOLN
INTRAMUSCULAR | Status: AC
  Filled 2019-07-06: qty 1

## 2019-07-06 MED ORDER — FENTANYL CITRATE (PF) 100 MCG/2ML IJ SOLN
INTRAMUSCULAR | Status: AC
  Filled 2019-07-06: qty 2

## 2019-07-06 MED ORDER — IBUPROFEN 600 MG PO TABS: 600.0000 mg | ORAL_TABLET | Freq: Four times a day (QID) | ORAL | 0 refills | Status: DC | PRN

## 2019-07-06 MED ORDER — DEXAMETHASONE SODIUM PHOSPHATE 10 MG/ML IJ SOLN
INTRAMUSCULAR | Status: AC
  Filled 2019-07-06: qty 1

## 2019-07-06 MED ORDER — FAMOTIDINE 20 MG PO TABS
20.0000 mg | ORAL_TABLET | Freq: Once | ORAL | Status: AC
Start: 2019-07-06 — End: 2019-07-06
  Administered 2019-07-06: 20 mg via ORAL

## 2019-07-06 MED ORDER — PROPOFOL 10 MG/ML IV BOLUS
INTRAVENOUS | Status: AC
  Filled 2019-07-06: qty 20

## 2019-07-06 MED ORDER — PROMETHAZINE HCL 25 MG/ML IJ SOLN: 6.2500 mg | INTRAMUSCULAR | Status: DC | PRN

## 2019-07-06 MED ORDER — ONDANSETRON HCL 4 MG/2ML IJ SOLN
INTRAMUSCULAR | Status: AC
  Filled 2019-07-06: qty 2

## 2019-07-06 MED ORDER — FAMOTIDINE 20 MG PO TABS
ORAL_TABLET | ORAL | Status: AC
  Filled 2019-07-06: qty 1

## 2019-07-06 MED ORDER — OXYCODONE HCL 5 MG PO TABS: 5.0000 mg | ORAL_TABLET | Freq: Once | ORAL | Status: DC | PRN

## 2019-07-06 MED ORDER — DOXYCYCLINE HYCLATE 50 MG PO CAPS: 100.0000 mg | ORAL_CAPSULE | Freq: Two times a day (BID) | ORAL | 0 refills | Status: DC

## 2019-07-06 MED ORDER — FENTANYL CITRATE (PF) 100 MCG/2ML IJ SOLN
INTRAMUSCULAR | Status: DC | PRN
  Administered 2019-07-06: 100 ug via INTRAVENOUS

## 2019-07-06 MED ORDER — LIDOCAINE HCL (CARDIAC) PF 100 MG/5ML IV SOSY
PREFILLED_SYRINGE | INTRAVENOUS | Status: DC | PRN
  Administered 2019-07-06: 60 mg via INTRAVENOUS

## 2019-07-06 MED ORDER — ACETAMINOPHEN 500 MG PO TABS
ORAL_TABLET | ORAL | Status: AC
  Filled 2019-07-06: qty 2

## 2019-07-06 MED ORDER — LIDOCAINE HCL 2 % IJ SOLN
INTRAMUSCULAR | Status: AC
  Filled 2019-07-06: qty 20

## 2019-07-06 MED ORDER — PROPOFOL 10 MG/ML IV BOLUS
INTRAVENOUS | Status: DC | PRN
  Administered 2019-07-06: 200 mg via INTRAVENOUS

## 2019-07-06 SURGICAL SUPPLY — 26 items
BRIEF STRETCH MATERNITY 2XLG (MISCELLANEOUS) ×2 IMPLANT
CATH ROBINSON RED A/P 16FR (CATHETERS) ×2 IMPLANT
DECANTER SPIKE VIAL GLASS SM (MISCELLANEOUS) ×4 IMPLANT
DILATOR CANAL MILEX (MISCELLANEOUS) IMPLANT
GLOVE BIO SURGEON STRL SZ 6.5 (GLOVE) ×3 IMPLANT
GLOVE BIO SURGEONS STRL SZ 6.5 (GLOVE) ×1
GLOVE BIOGEL PI IND STRL 7.0 (GLOVE) ×4 IMPLANT
GLOVE BIOGEL PI INDICATOR 7.0 (GLOVE) ×4
GOWN STRL REUS W/ TWL LRG LVL3 (GOWN DISPOSABLE) ×2 IMPLANT
GOWN STRL REUS W/ TWL XL LVL3 (GOWN DISPOSABLE) ×2 IMPLANT
GOWN STRL REUS W/TWL LRG LVL3 (GOWN DISPOSABLE) ×8
GOWN STRL REUS W/TWL XL LVL3 (GOWN DISPOSABLE)
HIBICLENS CHG 4% 4OZ BTL (MISCELLANEOUS) ×2 IMPLANT
KIT BERKELEY 1ST TRIMESTER 3/8 (MISCELLANEOUS) ×4 IMPLANT
NS IRRIG 1000ML POUR BTL (IV SOLUTION) ×4 IMPLANT
PACK VAGINAL MINOR WOMEN LF (CUSTOM PROCEDURE TRAY) ×4 IMPLANT
PAD OB MATERNITY 4.3X12.25 (PERSONAL CARE ITEMS) ×4 IMPLANT
PAD PREP 24X48 CUFFED NSTRL (MISCELLANEOUS) ×4 IMPLANT
SET BERKELEY SUCTION TUBING (SUCTIONS) ×4 IMPLANT
SLEEVE SCD COMPRESS KNEE MED (MISCELLANEOUS) ×4 IMPLANT
SYR 20ML LL LF (SYRINGE) ×4 IMPLANT
TOWEL GREEN STERILE FF (TOWEL DISPOSABLE) ×8 IMPLANT
VACURETTE 10 RIGID CVD (CANNULA) ×2 IMPLANT
VACURETTE 7MM CVD STRL WRAP (CANNULA) IMPLANT
VACURETTE 8 RIGID CVD (CANNULA) IMPLANT
VACURETTE 9 RIGID CVD (CANNULA) IMPLANT

## 2019-07-06 NOTE — Discharge Instructions (Signed)
Managing Pregnancy Loss Pregnancy loss can happen any time during a pregnancy. Often the cause is not known. It is rarely because of anything you did. Pregnancy loss in early pregnancy (during the first trimester) is called a miscarriage. This type of pregnancy loss is the most common. Pregnancy loss that happens after 20 weeks of pregnancy is called fetal demise if the baby's heart stops beating before birth. Fetal demise is much less common. Some women experience spontaneous labor shortly after fetal demise resulting in a stillborn birth (stillbirth). Any pregnancy loss can be devastating. You will need to recover both physically and emotionally. Most women are able to get pregnant again after a pregnancy loss and deliver a healthy baby. How to manage emotional recovery  Pregnancy loss is very hard emotionally. You may feel many different emotions while you grieve. You may feel sad and angry. You may also feel guilty. It is normal to have periods of crying. Emotional recovery can take longer than physical recovery. It is different for everyone. Taking these steps can help you in managing this loss:  Remember that it is unlikely you did anything to cause the pregnancy loss.  Share your thoughts and feelings with friends, family, and your partner. Remember that your partner is also recovering emotionally.  Make sure you have a good support system. Do not spend too much time alone.  Meet with a pregnancy loss counselor or join a pregnancy loss support group.  Get enough sleep and eat a healthy diet. Return to regular exercise when you have recovered physically.  Do not use drugs or alcohol to manage your emotions.  Consider seeing a mental health professional to help you recover emotionally.  Ask a friend or loved one to help you decide what to do with any clothing and nursery items you received for your baby. In the case of a stillbirth, many women benefit from taking additional steps in  the grieving process. You may want to:  Hold your baby after the birth.  Name your baby.  Request a birth certificate.  Create a keepsake such as handprints or footprints.  Dress your baby and have a picture taken.  Make funeral arrangements.  Ask for a baptism or blessing. Hospitals have staff members who can help you with all these arrangements. How to recognize emotional stress It is normal to have emotional stress after a pregnancy loss. But emotional stress that lasts a long time or becomes severe requires treatment. Watch out for these signs of severe emotional stress:  Sadness, anger, or guilt that is not going away and is interfering with your normal activities.  Relationship problems that have occurred or gotten worse since the pregnancy loss.  Signs of depression that last longer than 2 weeks. These may include: ? Sadness. ? Anxiety. ? Hopelessness. ? Loss of interest in activities you enjoy. ? Inability to concentrate. ? Trouble sleeping or sleeping too much. ? Loss of appetite or overeating. ? Thoughts of death or of hurting yourself. Follow these instructions at home:  Take over-the-counter and prescription medicines only as told by your health care provider.  Rest at home until your energy level returns. Return to your normal activities as told by your health care provider. Ask your health care provider what activities are safe for you.  When you are ready, meet with your health care provider to discuss steps to take for a future pregnancy.  Keep all follow-up visits as told by your health care provider. This is  important. Where to find support  To help you and your partner with the process of grieving, talk with your health care provider or seek counseling.  Consider meeting with others who have experienced pregnancy loss. Ask your health care provider about support groups and resources. Where to find more information  [place] Department of Health and Holiday representative on Women's Health: [place]  American Pregnancy Association: www.americanpregnancy.org Contact a health care provider if:  You continue to experience grief, sadness, or lack of motivation for everyday activities, and those feelings do not improve over time.  You are struggling to recover emotionally, especially if you are using alcohol or substances to help. Get help right away if:  You have thoughts of hurting yourself or others. If you ever feel like you may hurt yourself or others, or have thoughts about taking your own life, get help right away. You can go to your nearest emergency department or call:  Your local emergency services (911 in the [place]  A suicide crisis helpline, such as the [place] at (867)363-5654. This is open 24 hours a day. Summary  Any pregnancy loss can be difficult physically and emotionally.  You may experience many different emotions while you grieve. Emotional recovery can last longer than physical recovery.  It is normal to have emotional stress after a pregnancy loss. But emotional stress that lasts a long time or becomes severe requires treatment.  See your health care provider if you are struggling emotionally after a pregnancy loss. This information is not intended to replace advice given to you by your health care provider. Make sure you discuss any questions you have with your health care provider. Document Released: 10/13/2017 Document Revised: 11/22/2018 Document Reviewed: 10/13/2017 Elsevier Patient Education  [place]. Next dose of Tylenol can be taken at 7pm today Next dose of Ibuprofen can be taken at 10pm today   Post Anesthesia Home Care Instructions  Activity: Get plenty of rest for the remainder of the day. A responsible individual must stay with you for 24 hours following the procedure.  For the next 24 hours, DO NOT: -Drive a car -Paediatric nurse -Drink  alcoholic beverages -Take any medication unless instructed by your physician -Make any legal decisions or sign important papers.  Meals: Start with liquid foods such as gelatin or soup. Progress to regular foods as tolerated. Avoid greasy, spicy, heavy foods. If nausea and/or vomiting occur, drink only clear liquids until the nausea and/or vomiting subsides. Call your physician if vomiting continues.  Special Instructions/Symptoms: Your throat may feel dry or sore from the anesthesia or the breathing tube placed in your throat during surgery. If this causes discomfort, gargle with warm salt water. The discomfort should disappear within 24 hours.  If you had a scopolamine patch placed behind your ear for the management of post- operative nausea and/or vomiting:  1. The medication in the patch is effective for 72 hours, after which it should be removed.  Wrap patch in a tissue and discard in the trash. Wash hands thoroughly with soap and water. 2. You may remove the patch earlier than 72 hours if you experience unpleasant side effects which may include dry mouth, dizziness or visual disturbances. 3. Avoid touching the patch. Wash your hands with soap and water after contact with the patch.

## 2019-07-06 NOTE — Op Note (Signed)
Pre op DX :  missed abortion Postoperative Diagnosis: same Procedure:  dilation and evacuation Anesthesia:  MAC and local Surgeon:  Dr. Charlesetta Garibaldi Asst : none Complications : none Procedure in detail: The patient was taken to the operating room where she was given MAC anesthesia. She was placed in dorsal lithotomy position and prepped and draped in normal sterile fashion. In and out catheter was used to drain the bladder. This was examined and noted to have a 12 week size uterus with no adnexal masses. A bivalve was placed into the vagina. A tenaculum was placed on the cervix. The cervix was infiltrated with 20 cc 1% lidocaine paracervical block. The cervix then dilated with dilators up to 21.  A size 10 suction curettage was placed into the uterine cavity. A scant amount of products of conception was seen. The suction curettage was removed when a gritty texture was noted. A sharp curette was done along all walls  of the uterus. The suction curet was placed back into the uterine cavity. No further products of conception were obtained. Sponge lap and needle counts were correct. Patient back in stable condition. The patient understood to be the risks to be, but not  limited to,  Bleeding,  Infection,  damage to internal organs by perforation of the uterus and Asherman syndrome (scarring in the uterus) leading to infertility.    This was done with Ultrasound guidance

## 2019-07-06 NOTE — Transfer of Care (Signed)
Immediate Anesthesia Transfer of Care Note  Patient: Danielle Cherry  Procedure(s) Performed: DILATATION AND EVACUATION (N/A ) OPERATIVE ULTRASOUND (N/A )  Patient Location: PACU  Anesthesia Type:General  Level of Consciousness: sedated  Airway & Oxygen Therapy: Patient Spontanous Breathing  Post-op Assessment: Report given to RN and Post -op Vital signs reviewed and stable  Post vital signs: Reviewed and stable  Last Vitals:  Vitals Value Taken Time  BP 103/58 07/06/19 1522  Temp    Pulse 96 07/06/19 1524  Resp 19 07/06/19 1524  SpO2 98 % 07/06/19 1524  Vitals shown include unvalidated device data.  Last Pain:  Vitals:   07/06/19 1253  TempSrc: Oral  PainSc: 0-No pain      Patients Stated Pain Goal: 0 (01/74/94 [id number])  Complications: No apparent anesthesia complications

## 2019-07-06 NOTE — Anesthesia Procedure Notes (Signed)
Procedure Name: Intubation Date/Time: 07/06/2019 2:52 PM Performed by: Maryella Shivers, CRNA Pre-anesthesia Checklist: Patient identified, Emergency Drugs available, Suction available and Patient being monitored Patient Re-evaluated:Patient Re-evaluated prior to induction Oxygen Delivery Method: Circle system utilized Preoxygenation: Pre-oxygenation with 100% oxygen Induction Type: IV induction and Rapid sequence Laryngoscope Size: Mac and 3 Grade View: Grade I Tube type: Oral Tube size: 7.0 mm Number of attempts: 1 Airway Equipment and Method: Stylet and Oral airway Placement Confirmation: ETT inserted through vocal cords under direct vision,  positive ETCO2 and breath sounds checked- equal and bilateral Secured at: 20 cm Tube secured with: Tape Dental Injury: Teeth and Oropharynx as per pre-operative assessment

## 2019-07-06 NOTE — Anesthesia Preprocedure Evaluation (Addendum)
Anesthesia Evaluation  Patient identified by MRN, date of birth, ID band Patient awake    Reviewed: Allergy & Precautions, NPO status , Patient's Chart, lab work & pertinent test results  Airway Mallampati: I  TM Distance: >3 FB Neck ROM: Full    Dental no notable dental hx.    Pulmonary former smoker,    Pulmonary exam normal breath sounds clear to auscultation       Cardiovascular negative cardio ROS Normal cardiovascular exam Rhythm:Regular Rate:Normal     Neuro/Psych PSYCHIATRIC DISORDERS Depression negative neurological ROS     GI/Hepatic negative GI ROS, Neg liver ROS,   Endo/Other  negative endocrine ROS  Renal/GU negative Renal ROS     Musculoskeletal negative musculoskeletal ROS (+)   Abdominal (+) + obese,   Peds  Hematology negative hematology ROS (+)   Anesthesia Other Findings Missed Abortion  Reproductive/Obstetrics Missed AB at 13 weeks by dates, 10 3/7 weeks by size                            Anesthesia Physical Anesthesia Plan  ASA: II  Anesthesia Plan: General   Post-op Pain Management:    Induction: Intravenous  PONV Risk Score and Plan: 2 and Ondansetron, Dexamethasone, Midazolam and Treatment may vary due to age or medical condition  Airway Management Planned: Oral ETT  Additional Equipment:   Intra-op Plan:   Post-operative Plan: Extubation in OR  Informed Consent: I have reviewed the patients History and Physical, chart, labs and discussed the procedure including the risks, benefits and alternatives for the proposed anesthesia with the patient or authorized representative who has indicated his/her understanding and acceptance.     Dental advisory given  Plan Discussed with: CRNA  Anesthesia Plan Comments:      Anesthesia Quick Evaluation

## 2019-07-06 NOTE — Anesthesia Postprocedure Evaluation (Signed)
Anesthesia Post Note  Patient: Otelia Limes  Procedure(s) Performed: DILATATION AND EVACUATION (N/A Vagina ) OPERATIVE ULTRASOUND (N/A Abdomen)     Patient location during evaluation: PACU Anesthesia Type: General Level of consciousness: awake and alert Pain management: pain level controlled Vital Signs Assessment: post-procedure vital signs reviewed and stable Respiratory status: spontaneous breathing, nonlabored ventilation, respiratory function stable and patient connected to nasal cannula oxygen Cardiovascular status: blood pressure returned to baseline and stable Postop Assessment: no apparent nausea or vomiting Anesthetic complications: no    Last Vitals:  Vitals:   07/06/19 1545 07/06/19 1600  BP: (!) 84/47 114/72  Pulse: 79 78  Resp: 13 14  Temp:    SpO2: 100% 100%    Last Pain:  Vitals:   07/06/19 1545  TempSrc:   PainSc: 6                  Alianna Wurster L Trammell Bowden

## 2019-07-09 ENCOUNTER — Encounter (HOSPITAL_BASED_OUTPATIENT_CLINIC_OR_DEPARTMENT_OTHER): Payer: Self-pay | Admitting: Obstetrics and Gynecology

## 2019-07-09 LAB — SURGICAL PATHOLOGY

## 2019-08-17 NOTE — L&D Delivery Note (Addendum)
Delivery Note At 5:19 PM a viable female was delivered via Vaginal, Spontaneous (Presentation: Direct  Occiput Anterior).  APGAR: 9, 9; weight pending.   Placenta status: Spontaneous, Intact.  Cord: 3 vessels with the following complications: None.  Cord pH: None sent.   Anesthesia: None Episiotomy: None Lacerations: None Suture Repair: None Est. Blood Loss (mL): 80  Mom to postpartum.  Baby to Couplet care / Skin to Skin. Briley Bumgarner WAKURU. MD.  06/09/2020, 6:16 PM

## 2019-11-29 ENCOUNTER — Other Ambulatory Visit: Payer: Self-pay

## 2019-11-29 ENCOUNTER — Encounter (HOSPITAL_COMMUNITY): Payer: Self-pay | Admitting: Obstetrics and Gynecology

## 2019-11-29 ENCOUNTER — Inpatient Hospital Stay (HOSPITAL_COMMUNITY)
Admission: AD | Admit: 2019-11-29 | Discharge: 2019-11-29 | Disposition: A | Discharge status: Home / Self Care | Payer: Medicaid Other | Attending: Obstetrics and Gynecology | Admitting: Obstetrics and Gynecology

## 2019-11-29 DIAGNOSIS — Z3491 Encounter for supervision of normal pregnancy, unspecified, first trimester: Secondary | ICD-10-CM

## 2019-11-29 DIAGNOSIS — M545 Low back pain: Secondary | ICD-10-CM | POA: Diagnosis not present

## 2019-11-29 DIAGNOSIS — R109 Unspecified abdominal pain: Secondary | ICD-10-CM | POA: Insufficient documentation

## 2019-11-29 DIAGNOSIS — Z8249 Family history of ischemic heart disease and other diseases of the circulatory system: Secondary | ICD-10-CM | POA: Diagnosis not present

## 2019-11-29 DIAGNOSIS — Z87891 Personal history of nicotine dependence: Secondary | ICD-10-CM | POA: Insufficient documentation

## 2019-11-29 DIAGNOSIS — Z8349 Family history of other endocrine, nutritional and metabolic diseases: Secondary | ICD-10-CM | POA: Diagnosis not present

## 2019-11-29 DIAGNOSIS — Z91040 Latex allergy status: Secondary | ICD-10-CM | POA: Insufficient documentation

## 2019-11-29 DIAGNOSIS — Z3A10 10 weeks gestation of pregnancy: Secondary | ICD-10-CM | POA: Diagnosis not present

## 2019-11-29 DIAGNOSIS — Z833 Family history of diabetes mellitus: Secondary | ICD-10-CM | POA: Insufficient documentation

## 2019-11-29 DIAGNOSIS — O99891 Other specified diseases and conditions complicating pregnancy: Secondary | ICD-10-CM | POA: Insufficient documentation

## 2019-11-29 DIAGNOSIS — O26891 Other specified pregnancy related conditions, first trimester: Secondary | ICD-10-CM | POA: Diagnosis not present

## 2019-11-29 NOTE — MAU Note (Signed)
Been having sharp pains in her lower back, now they are starting to be in the front.  Started at 1300. Denies bleeding.  Pt is 10 wks preg, confirmed at [facility].  Same signs as when she had SAB last Nov

## 2019-11-29 NOTE — MAU Provider Note (Signed)
History     CSN: [id number]  Arrival date and time: 11/29/19 1637   First Provider Initiated Contact with Patient 11/29/19 1745      Chief Complaint  Patient presents with  . Abdominal Pain  . Back Pain   HPI Danielle Cherry is a 34 y.o. [id number] at [redacted]w[redacted]d who presents to MAU with chief complaint of low back pain. This is a new problem, onset at about 1300 hours today. Her pain is rated as 1/10 and radiates to her lower abdomen, where she rates her pain as 5/10. She has not taken medication or tried other treatments for this complaint.  Patient is s/p fetal demise followed by D&E 06/2019. She states her symptoms today are identical to her symptoms at that time and she fears and impending miscarriage.  She denies vaginal bleeding, abdominal tenderness, dysuria, fever or recent illness. Patient is s/p prenatal appointment with [facility] Monday 04/12. She states she had an ultrasound to confirm IUP and all labs had normal results. She is being seen q 2 weeks due to her history.  OB History    Gravida  6   Para  3   Term  3   Preterm      AB  2   Living  3     SAB  2   TAB      Ectopic      Multiple  0   Live Births  3        Obstetric Comments  Bled after delivery of daughter, "had to get a shot"        Past Medical History:  Diagnosis Date  . Depression    took meds briefly, going to therapy, doing good  . No pertinent past medical history     Past Surgical History:  Procedure Laterality Date  . DILATION AND EVACUATION N/A 07/06/2019   Procedure: DILATATION AND EVACUATION;  Surgeon: Jaymes Graff, MD;  Location: [facility];  Service: Gynecology;  Laterality: N/A;  Ultrasound Guidance  . NO PAST SURGERIES    . OPERATIVE ULTRASOUND N/A 07/06/2019   Procedure: OPERATIVE ULTRASOUND;  Surgeon: Jaymes Graff, MD;  Location: [facility];  Service: Gynecology;  Laterality: N/A;    Family History  Problem Relation Age of Onset  .  Diabetes Mother   . Hypertension Mother   . Hyperlipidemia Father   . Cancer Paternal Grandfather        lung  . Parkinson's disease Paternal Grandmother     Social History   Tobacco Use  . Smoking status: Former Smoker    Types: Cigarettes  . Smokeless tobacco: Never Used  . Tobacco comment: quit 2009  Substance Use Topics  . Alcohol use: Not Currently    Alcohol/week: 1.0 standard drinks    Types: 1 Glasses of wine per week    Comment: social, not with preg  . Drug use: No    Allergies:  Allergies  Allergen Reactions  . Strawberry Extract Anaphylaxis  . Latex Itching    No medications prior to admission.    Review of Systems  Gastrointestinal: Positive for abdominal pain.  Genitourinary: Negative for vaginal bleeding, vaginal discharge and vaginal pain.  Musculoskeletal: Positive for back pain.  All other systems reviewed and are negative.  Physical Exam   Blood pressure 114/62, pulse 75, temperature 98.7 F (37.1 C), temperature source Oral, resp. rate 18, height 4\' 11"  (1.499 m), weight 73.8 kg, last menstrual period 09/16/2019, SpO2 100 %,  unknown if currently breastfeeding.  Physical Exam  Nursing note and vitals reviewed. Constitutional: She is oriented to person, place, and time. She appears well-developed and well-nourished.  Cardiovascular: Normal rate and normal heart sounds.  Respiratory: Effort normal.  GI: Soft. There is no abdominal tenderness.  Neurological: She is alert and oriented to person, place, and time.  Skin: Skin is warm and dry.  Psychiatric: She has a normal mood and affect. Her behavior is normal. Judgment and thought content normal.    MAU Course  Procedures  Bedside Ultrasound for FHR check: Patient informed that the ultrasound is considered a limited obstetric ultrasound and is not intended to be a complete ultrasound exam.  Patient also informed that the ultrasound is not being completed with the intent of assessing for fetal  or placental anomalies or any pelvic abnormalities.  Explained that the purpose of today's ultrasound is to assess for fetal heart rate.  Patient acknowledges the purpose of the exam and the limitations of the study.     Assessment and Plan  --34 y.o. [id number] with IUP at [redacted]w[redacted]d  --[place] [address] [place] --Patient declined all additional workup --Discharge home in stable condition  F/U: --Patient has appt with [facility] in about two weeks  Darlina Rumpf, CNM 11/29/2019, 7:11 PM

## 2019-11-29 NOTE — Discharge Instructions (Signed)
First Trimester of Pregnancy  The first trimester of pregnancy is from week 1 until the end of week 13 (months 1 through 3). During this time, your baby will begin to develop inside you. At 6-8 weeks, the eyes and face are formed, and the heartbeat can be seen on ultrasound. At the end of 12 weeks, all the baby's organs are formed. Prenatal care is all the medical care you receive before the birth of your baby. Make sure you get good prenatal care and follow all of your doctor's instructions. Follow these instructions at home: Medicines  Take over-the-counter and prescription medicines only as told by your doctor. Some medicines are safe and some medicines are not safe during pregnancy.  Take a prenatal vitamin that contains at least 600 micrograms (mcg) of folic acid.  If you have trouble pooping (constipation), take medicine that will make your stool soft (stool softener) if your doctor approves. Eating and drinking   Eat regular, healthy meals.  Your doctor will tell you the amount of weight gain that is right for you.  Avoid raw meat and uncooked cheese.  If you feel sick to your stomach (nauseous) or throw up (vomit): ? Eat 4 or 5 small meals a day instead of 3 large meals. ? Try eating a few soda crackers. ? Drink liquids between meals instead of during meals.  To prevent constipation: ? Eat foods that are high in fiber, like fresh fruits and vegetables, whole grains, and beans. ? Drink enough fluids to keep your pee (urine) clear or pale yellow. Activity  Exercise only as told by your doctor. Stop exercising if you have cramps or pain in your lower belly (abdomen) or low back.  Do not exercise if it is too hot, too humid, or if you are in a place of great height (high altitude).  Try to avoid standing for long periods of time. Move your legs often if you must stand in one place for a long time.  Avoid heavy lifting.  Wear low-heeled shoes. Sit and stand up  straight.  You can have sex unless your doctor tells you not to. Relieving pain and discomfort  Wear a good support bra if your breasts are sore.  Take warm water baths (sitz baths) to soothe pain or discomfort caused by hemorrhoids. Use hemorrhoid cream if your doctor says it is okay.  Rest with your legs raised if you have leg cramps or low back pain.  If you have puffy, bulging veins (varicose veins) in your legs: ? Wear support hose or compression stockings as told by your doctor. ? Raise (elevate) your feet for 15 minutes, 3-4 times a day. ? Limit salt in your food. Prenatal care  Schedule your prenatal visits by the twelfth week of pregnancy.  Write down your questions. Take them to your prenatal visits.  Keep all your prenatal visits as told by your doctor. This is important. Safety  Wear your seat belt at all times when driving.  Make a list of emergency phone numbers. The list should include numbers for family, friends, the hospital, and police and fire departments. General instructions  Ask your doctor for a referral to a local prenatal class. Begin classes no later than at the start of month 6 of your pregnancy.  Ask for help if you need counseling or if you need help with nutrition. Your doctor can give you advice or tell you where to go for help.  Do not use hot tubs, steam   rooms, or saunas.  Do not douche or use tampons or scented sanitary pads.  Do not cross your legs for long periods of time.  Avoid all herbs and alcohol. Avoid drugs that are not approved by your doctor.  Do not use any tobacco products, including cigarettes, chewing tobacco, and electronic cigarettes. If you need help quitting, ask your doctor. You may get counseling or other support to help you quit.  Avoid cat litter boxes and soil used by cats. These carry germs that can cause birth defects in the baby and can cause a loss of your baby (miscarriage) or stillbirth.  Visit your dentist.  At home, brush your teeth with a soft toothbrush. Be gentle when you floss. Contact a doctor if:  You are dizzy.  You have mild cramps or pressure in your lower belly.  You have a nagging pain in your belly area.  You continue to feel sick to your stomach, you throw up, or you have watery poop (diarrhea).  You have a bad smelling fluid coming from your vagina.  You have pain when you pee (urinate).  You have increased puffiness (swelling) in your face, hands, legs, or ankles. Get help right away if:  You have a fever.  You are leaking fluid from your vagina.  You have spotting or bleeding from your vagina.  You have very bad belly cramping or pain.  You gain or lose weight rapidly.  You throw up blood. It may look like coffee grounds.  You are around people who have German measles, fifth disease, or chickenpox.  You have a very bad headache.  You have shortness of breath.  You have any kind of trauma, such as from a fall or a car accident. Summary  The first trimester of pregnancy is from week 1 until the end of week 13 (months 1 through 3).  To take care of yourself and your unborn baby, you will need to eat healthy meals, take medicines only if your doctor tells you to do so, and do activities that are safe for you and your baby.  Keep all follow-up visits as told by your doctor. This is important as your doctor will have to ensure that your baby is healthy and growing well. This information is not intended to replace advice given to you by your health care provider. Make sure you discuss any questions you have with your health care provider. Document Revised: 11/23/2018 Document Reviewed: 08/10/2016 Elsevier Patient Education  2020 [employer]vier Inc.  

## 2019-11-29 NOTE — MAU Note (Signed)
Viable IUP on [place] on Mon.

## 2020-03-28 ENCOUNTER — Ambulatory Visit (HOSPITAL_COMMUNITY)
Admission: EM | Admit: 2020-03-28 | Discharge: 2020-03-28 | Disposition: A | Discharge status: Home / Self Care | Payer: Medicaid Other | Attending: Urgent Care | Admitting: Urgent Care

## 2020-03-28 ENCOUNTER — Encounter (HOSPITAL_COMMUNITY): Payer: Self-pay

## 2020-03-28 ENCOUNTER — Other Ambulatory Visit: Payer: Self-pay

## 2020-03-28 DIAGNOSIS — H9203 Otalgia, bilateral: Secondary | ICD-10-CM

## 2020-03-28 DIAGNOSIS — J3089 Other allergic rhinitis: Secondary | ICD-10-CM

## 2020-03-28 DIAGNOSIS — J3489 Other specified disorders of nose and nasal sinuses: Secondary | ICD-10-CM

## 2020-03-28 DIAGNOSIS — H938X3 Other specified disorders of ear, bilateral: Secondary | ICD-10-CM

## 2020-03-28 MED ORDER — PSEUDOEPHEDRINE HCL 30 MG PO TABS: 30.0000 mg | ORAL_TABLET | Freq: Three times a day (TID) | ORAL | 0 refills | Status: DC | PRN

## 2020-03-28 MED ORDER — CETIRIZINE HCL 10 MG PO TABS: 10.0000 mg | ORAL_TABLET | Freq: Every day | ORAL | 0 refills | Status: DC

## 2020-03-28 NOTE — ED Triage Notes (Signed)
Pt presents with bilateral ear pain and facial pain since Wednesday.

## 2020-03-28 NOTE — ED Provider Notes (Signed)
[facility]   MRN: [id number] DOB: Jul 01, 1986  Subjective:   Danielle Cherry is a 34 y.o. female presenting for 2-day history of bilateral ear pain/fullness, ear popping.  Patient has also had a runny stuffy nose.  She is [redacted] weeks pregnant.  Has not tried medications for relief because of her pregnancy.  Denies fever, sinus pain, throat pain, cough, chest pain, shortness of breath, loss of sense of taste and smell.  Patient has minimized her contacts and does not have any Covid exposure that she knows of.  No current facility-administered medications for this encounter. No current outpatient medications on file.   Allergies  Allergen Reactions  . Strawberry Extract Anaphylaxis  . Latex Itching    Past Medical History:  Diagnosis Date  . Depression    took meds briefly, going to therapy, doing good  . No pertinent past medical history      Past Surgical History:  Procedure Laterality Date  . DILATION AND EVACUATION N/A 07/06/2019   Procedure: DILATATION AND EVACUATION;  Surgeon: Jaymes Graff, MD;  Location: [facility];  Service: Gynecology;  Laterality: N/A;  Ultrasound Guidance  . NO PAST SURGERIES    . OPERATIVE ULTRASOUND N/A 07/06/2019   Procedure: OPERATIVE ULTRASOUND;  Surgeon: Jaymes Graff, MD;  Location: [facility];  Service: Gynecology;  Laterality: N/A;    Family History  Problem Relation Age of Onset  . Diabetes Mother   . Hypertension Mother   . Hyperlipidemia Father   . Cancer Paternal Grandfather        lung  . Parkinson's disease Paternal Grandmother     Social History   Tobacco Use  . Smoking status: Former Smoker    Types: Cigarettes  . Smokeless tobacco: Never Used  . Tobacco comment: quit 2009  Vaping Use  . Vaping Use: Never used  Substance Use Topics  . Alcohol use: Not Currently    Alcohol/week: 1.0 standard drink    Types: 1 Glasses of wine per week    Comment: social, not with preg  . Drug use: No     ROS   Objective:   Vitals: BP (!) 125/55 (BP Location: Right Arm)   Pulse 73   Temp 98.8 F (37.1 C) (Oral)   Resp 17   LMP 09/16/2019   SpO2 98%   Physical Exam Constitutional:      General: She is not in acute distress.    Appearance: Normal appearance. She is well-developed. She is not ill-appearing, toxic-appearing or diaphoretic.  HENT:     Head: Normocephalic and atraumatic.     Right Ear: Ear canal and external ear normal. No drainage or tenderness. No middle ear effusion. There is no impacted cerumen. Tympanic membrane is not erythematous.     Left Ear: Ear canal and external ear normal. No drainage or tenderness.  No middle ear effusion. There is no impacted cerumen. Tympanic membrane is not erythematous.     Ears:     Comments: TMs opacified bilaterally but otherwise without perforations, erythema.    Nose: Nose normal. No congestion or rhinorrhea.     Mouth/Throat:     Mouth: Mucous membranes are moist. No oral lesions.     Pharynx: Oropharynx is clear. No pharyngeal swelling, oropharyngeal exudate, posterior oropharyngeal erythema or uvula swelling.     Tonsils: No tonsillar exudate or tonsillar abscesses.  Eyes:     General: No scleral icterus.       Right eye: No discharge.  Left eye: No discharge.     Extraocular Movements: Extraocular movements intact.     Right eye: Normal extraocular motion.     Left eye: Normal extraocular motion.     Conjunctiva/sclera: Conjunctivae normal.     Pupils: Pupils are equal, round, and reactive to light.  Cardiovascular:     Rate and Rhythm: Normal rate.  Pulmonary:     Effort: Pulmonary effort is normal.  Musculoskeletal:     Cervical back: Normal range of motion and neck supple.  Lymphadenopathy:     Cervical: No cervical adenopathy.  Skin:    General: Skin is warm and dry.  Neurological:     General: No focal deficit present.     Mental Status: She is alert and oriented to person, place, and time.    Psychiatric:        Mood and Affect: Mood normal.        Behavior: Behavior normal.        Thought Content: Thought content normal.        Judgment: Judgment normal.      Assessment and Plan :   PDMP not reviewed this encounter.  1. Allergic rhinitis due to other allergic trigger, unspecified seasonality   2. Acute ear pain, bilateral   3. Ear fullness, bilateral   4. Stuffy and runny nose     Recommended supportive care including Zyrtec and Sudafed.  Follow-up with obstetrician regarding safety of medications in pregnancy.  As per up-to-date, these medications should be safe but encouraged patient to discuss this with her obstetrician.  She does not want COVID-19 testing today. Counseled patient on potential for adverse effects with medications prescribed/recommended today, ER and return-to-clinic precautions discussed, patient verbalized understanding.    Wallis Bamberg, PA-C 03/28/20 1444

## 2020-03-31 ENCOUNTER — Encounter (HOSPITAL_COMMUNITY): Payer: Self-pay | Admitting: Obstetrics and Gynecology

## 2020-03-31 ENCOUNTER — Other Ambulatory Visit: Payer: Self-pay

## 2020-03-31 ENCOUNTER — Inpatient Hospital Stay (HOSPITAL_COMMUNITY)
Admission: AD | Admit: 2020-03-31 | Discharge: 2020-04-03 | DRG: 833 | Disposition: A | Discharge status: Home / Self Care | Payer: Medicaid Other | Attending: Obstetrics & Gynecology | Admitting: Obstetrics & Gynecology

## 2020-03-31 DIAGNOSIS — F419 Anxiety disorder, unspecified: Secondary | ICD-10-CM | POA: Diagnosis present

## 2020-03-31 DIAGNOSIS — Z3A28 28 weeks gestation of pregnancy: Secondary | ICD-10-CM | POA: Diagnosis not present

## 2020-03-31 DIAGNOSIS — O99343 Other mental disorders complicating pregnancy, third trimester: Secondary | ICD-10-CM | POA: Diagnosis present

## 2020-03-31 DIAGNOSIS — O4453 Low lying placenta with hemorrhage, third trimester: Secondary | ICD-10-CM | POA: Diagnosis present

## 2020-03-31 DIAGNOSIS — Z91040 Latex allergy status: Secondary | ICD-10-CM

## 2020-03-31 DIAGNOSIS — Z20822 Contact with and (suspected) exposure to covid-19: Secondary | ICD-10-CM | POA: Diagnosis present

## 2020-03-31 DIAGNOSIS — F329 Major depressive disorder, single episode, unspecified: Secondary | ICD-10-CM | POA: Diagnosis present

## 2020-03-31 DIAGNOSIS — O4443 Low lying placenta NOS or without hemorrhage, third trimester: Secondary | ICD-10-CM | POA: Diagnosis not present

## 2020-03-31 DIAGNOSIS — O4693 Antepartum hemorrhage, unspecified, third trimester: Secondary | ICD-10-CM | POA: Diagnosis present

## 2020-03-31 LAB — CBC
HCT: 35.9 % — ABNORMAL LOW (ref 36.0–46.0)
Hemoglobin: 11.8 g/dL — ABNORMAL LOW (ref 12.0–15.0)
MCH: 30.5 pg (ref 26.0–34.0)
MCHC: 32.9 g/dL (ref 30.0–36.0)
MCV: 92.8 fL (ref 80.0–100.0)
Platelets: 184 10*3/uL (ref 150–400)
RBC: 3.87 MIL/uL (ref 3.87–5.11)
RDW: 12.9 % (ref 11.5–15.5)
WBC: 11.9 10*3/uL — ABNORMAL HIGH (ref 4.0–10.5)
nRBC: 0 % (ref 0.0–0.2)

## 2020-03-31 LAB — TYPE AND SCREEN
ABO/RH(D): A POS
Antibody Screen: NEGATIVE

## 2020-03-31 LAB — SARS CORONAVIRUS 2 BY RT PCR (HOSPITAL ORDER, PERFORMED IN ~~LOC~~ HOSPITAL LAB): SARS Coronavirus 2: NEGATIVE

## 2020-03-31 MED ORDER — CALCIUM CARBONATE ANTACID 500 MG PO CHEW: 2.0000 | CHEWABLE_TABLET | ORAL | Status: DC | PRN

## 2020-03-31 MED ORDER — ZOLPIDEM TARTRATE 5 MG PO TABS
5.0000 mg | ORAL_TABLET | Freq: Every evening | ORAL | Status: DC | PRN
  Administered 2020-04-01: 5 mg via ORAL
  Filled 2020-03-31: qty 1

## 2020-03-31 MED ORDER — DOCUSATE SODIUM 100 MG PO CAPS
100.0000 mg | ORAL_CAPSULE | Freq: Every day | ORAL | Status: DC
  Administered 2020-04-01 – 2020-04-03 (×2): 100 mg via ORAL
  Filled 2020-03-31 (×2): qty 1

## 2020-03-31 MED ORDER — ACETAMINOPHEN 325 MG PO TABS
650.0000 mg | ORAL_TABLET | ORAL | Status: DC | PRN
  Administered 2020-04-01: 650 mg via ORAL
  Filled 2020-03-31: qty 2

## 2020-03-31 MED ORDER — PRENATAL MULTIVITAMIN CH
1.0000 | ORAL_TABLET | Freq: Every day | ORAL | Status: DC
  Administered 2020-04-01 – 2020-04-03 (×2): 1 via ORAL
  Filled 2020-03-31 (×2): qty 1

## 2020-03-31 MED ORDER — HYDROXYZINE HCL 50 MG PO TABS: 50.0000 mg | ORAL_TABLET | Freq: Three times a day (TID) | ORAL | Status: DC | PRN

## 2020-03-31 NOTE — MAU Note (Signed)
.   Danielle Cherry is a 34 y.o. at [redacted]w[redacted]d here in MAU reporting: she started having vaginal bleeding around 1430 today. Denies any pain. Pt does have a low lying placenta  Onset of complaint: today Pain score:0 Vitals:   03/31/20 1730  BP: 134/85  Pulse: 96  Resp: 16  Temp: 98.2 F (36.8 C)  SpO2: 100%     FHT:130 Lab orders placed from triage:

## 2020-03-31 NOTE — H&P (Signed)
Danielle Cherry is a 34 y.o. female, [phone], IUP at 28.1 weeks, presenting for Vaginal bleeding in third trimester, with history of placenta previa Noted at 21 weeks, recheck in 4 weeks--NOW LOW LYING, RECHECK IN 4 WEEKS, pt endorses having intercourse 2 days ago, no other trauma, blood type A+. Depressive disorder no meds. H/O miscarriage x2, 2008, 06/2019.  Pt endorse + Fm. Denies feeling cxt's. Latex allergy.    Patient Active Problem List   Diagnosis Date Noted  . Hospice care patient 07/06/2019  . Missed abortion 07/05/2019  . Depression 12/12/2017     Medications Prior to Admission  Medication Sig Dispense Refill Last Dose  . cetirizine (ZYRTEC ALLERGY) 10 MG tablet Take 1 tablet (10 mg total) by mouth daily. 30 tablet 0 03/31/2020 at Unknown time  . pseudoephedrine (SUDAFED) 30 MG tablet Take 1 tablet (30 mg total) by mouth every 8 (eight) hours as needed for congestion. 30 tablet 0 03/31/2020 at Unknown time    Past Medical History:  Diagnosis Date  . Depression    took meds briefly, going to therapy, doing good  . No pertinent past medical history      No current facility-administered medications on file prior to encounter.   Current Outpatient Medications on File Prior to Encounter  Medication Sig Dispense Refill  . cetirizine (ZYRTEC ALLERGY) 10 MG tablet Take 1 tablet (10 mg total) by mouth daily. 30 tablet 0  . pseudoephedrine (SUDAFED) 30 MG tablet Take 1 tablet (30 mg total) by mouth every 8 (eight) hours as needed for congestion. 30 tablet 0     Allergies  Allergen Reactions  . Strawberry Extract Anaphylaxis  . Latex Itching    History of present pregnancy: Pt Info/Preference:  Screening/Consents:  Labs:   EDD: Estimated Date of Delivery: 06/22/20  Establised: Patient's last menstrual period was 09/16/2019.  Anatomy Scan: Date: 03/24/2020 Placenta Location: anterior Genetic Screen: Panoroma:low risk female AFP:  First Tri: Quad:  Office: [facility]             First  PNV: 10.1 wg Blood Type  A+  Language: english Last PNV: 27.2 wg Rhogam  N/A  Flu Vaccine:  Declined   Antibody  Negative  TDaP vaccine UTD   GTT: Early: 5.5 hga1c Third Trimester: Has not done yet  Feeding Plan: breast BTL: no Rubella:  Immune  Contraception: ??? VBAC: no RPR:   NR  Circumcision: N/A Female   HBsAg:  Neg  Pediatrician:  ???   HIV:   NR  Prenatal Classes: no Additional [place]: 8/9 second anatomy scan  GBS:  N/A(For PCN allergy, check sensitivities)       Chlamydia: neg    MFM Referral/Consult:  GC: neg  Support Person: Mother and husband   PAP: 2020-normal  Pain Management: ??? Neonatologist Referral:  Hgb Electrophoresis:  AA  Birth Plan: no   Hgb NOB: 13.1    28W: Has not performed yet, will check here upon admission   03/24/2020 Anatomy with growth:   OB History    Gravida  6   Para  3   Term  3   Preterm      AB  2   Living  3     SAB  2   TAB      Ectopic      Multiple  0   Live Births  3        Obstetric Comments  Bled after delivery of daughter, "had to get a  shot"       Past Medical History:  Diagnosis Date  . Depression    took meds briefly, going to therapy, doing good  . No pertinent past medical history    Past Surgical History:  Procedure Laterality Date  . DILATION AND EVACUATION N/A 07/06/2019   Procedure: DILATATION AND EVACUATION;  Surgeon: Jaymes Graff, MD;  Location: [facility];  Service: Gynecology;  Laterality: N/A;  Ultrasound Guidance  . NO PAST SURGERIES    . OPERATIVE ULTRASOUND N/A 07/06/2019   Procedure: OPERATIVE ULTRASOUND;  Surgeon: Jaymes Graff, MD;  Location: [facility];  Service: Gynecology;  Laterality: N/A;   Family History: family history includes Cancer in her paternal grandfather; Diabetes in her mother; Hyperlipidemia in her father; Hypertension in her mother; Parkinson's disease in her paternal grandmother. Social History:  reports that she has quit smoking. Her smoking  use included cigarettes. She has never used smokeless tobacco. She reports previous alcohol use of about 1.0 standard drink of alcohol per week. She reports that she does not use drugs.   Prenatal Transfer Tool  Maternal Diabetes: No Genetic Screening: Normal Maternal Ultrasounds/Referrals: Normal Fetal Ultrasounds or other Referrals:  None Maternal Substance Abuse:  No Significant Maternal Medications:  None Significant Maternal Lab Results: None and Other: Unknown   ROS:  Review of Systems  Constitutional: Negative.   HENT: Negative.   Eyes: Negative.   Respiratory: Negative.   Cardiovascular: Negative.   Gastrointestinal: Negative.        Lower uterine pressure, cramps off and on usual during the entire pregnancy  Genitourinary: Negative.   Musculoskeletal: Positive for back pain.       Per pt has had entire pregnancy   Skin: Negative.   Neurological: Negative.   Endo/Heme/Allergies: Negative.   Psychiatric/Behavioral: Negative.      Physical Exam: BP 134/85   Pulse 96   Temp 98.2 F (36.8 C)   Resp 16   LMP 09/16/2019   SpO2 100%   Physical Exam Vitals and nursing note reviewed. Exam conducted with a chaperone present.  Constitutional:      Appearance: Normal appearance.  HENT:     Head: Normocephalic and atraumatic.     Nose: Nose normal.     Mouth/Throat:     Mouth: Mucous membranes are moist.     Pharynx: Oropharynx is clear.  Eyes:     Extraocular Movements: Extraocular movements intact.     Conjunctiva/sclera: Conjunctivae normal.     Pupils: Pupils are equal, round, and reactive to light.  Cardiovascular:     Rate and Rhythm: Normal rate and regular rhythm.     Pulses: Normal pulses.     Heart sounds: Normal heart sounds.  Pulmonary:     Effort: Pulmonary effort is normal.     Breath sounds: Normal breath sounds.  Abdominal:     General: Bowel sounds are normal.     Palpations: Abdomen is soft.  Genitourinary:    General: Normal vulva.      Vagina: Vaginal discharge present.     Rectum: Normal.     Comments: Vaginal blood noted on speculum exam, x1 quarter size clot and x1 dime size clot, totals , no further bleeding noted in vaginal vault when asked to cough.  Musculoskeletal:        General: Normal range of motion.     Cervical back: Normal range of motion and neck supple.  Skin:    General: Skin is warm  and dry.     Capillary Refill: Capillary refill takes less than 2 seconds.  Neurological:     General: No focal deficit present.     Mental Status: She is alert and oriented to person, place, and time.  Psychiatric:        Mood and Affect: Mood normal.        Behavior: Behavior normal.        Thought Content: Thought content normal.        Judgment: Judgment normal.     Comments: Anxious, teary eyed.       NST: FHR baseline 140 bpm, Variability: moderate, Accelerations:present, Decelerations:  Absent= Cat 1/Reactive UC:   none SVE:    , vertex verified by fetal sutures.  Leopold's: Position vertex per [place] 8/9, EFW 2.1lbs per [place] 8/9 via leopold's.   Labs: No results found for this or any previous visit (from the past 24 hour(s)).  Imaging:  No results found.  MAU Course: No orders of the defined types were placed in this encounter.  No orders of the defined types were placed in this encounter.   Assessment/Plan: Laurice Iglesia is a 34 y.o. female, [phone], IUP at 28.1 weeks, presenting for Vaginal bleeding in third trimester, with history of placenta previa Noted at 21 weeks, recheck in 4 weeks--NOW LOW LYING, RECHECK IN 4 WEEKS, pt endorses having intercourse 2 days ago, no other trauma, blood type A+. Depressive disorder no meds. H/O miscarriage x2, 2008, 06/2019.  Pt endorse + Fm. Denies feeling cxt's. Latex allergy  FWB: Cat 1 Fetal Tracing.   Plan: Admit to [facility] Ante per consult with Dr Sallye Ober Routine [facility] orders Tylenol, heating pad for chronic back pain.  Anxiety: Atarax 50mg  TID PRN  Bedrest with  bathroom privilege, regular diet continuous toco Pad count Cbc now and in the morning. UC Pending.   SCD Anticipate observation until 24-48 hours no bleeding.   NP-C, CNM, MSN 03/31/2020, 6:08 PM

## 2020-04-01 LAB — URINALYSIS, ROUTINE W REFLEX MICROSCOPIC
Bacteria, UA: NONE SEEN
Bilirubin Urine: NEGATIVE
Glucose, UA: NEGATIVE mg/dL
Ketones, ur: NEGATIVE mg/dL
Leukocytes,Ua: NEGATIVE
Nitrite: NEGATIVE
Protein, ur: NEGATIVE mg/dL
Specific Gravity, Urine: 1.02 (ref 1.005–1.030)
pH: 6 (ref 5.0–8.0)

## 2020-04-01 LAB — CBC
HCT: 33.7 % — ABNORMAL LOW (ref 36.0–46.0)
Hemoglobin: 10.9 g/dL — ABNORMAL LOW (ref 12.0–15.0)
MCH: 30.4 pg (ref 26.0–34.0)
MCHC: 32.3 g/dL (ref 30.0–36.0)
MCV: 93.9 fL (ref 80.0–100.0)
Platelets: 180 10*3/uL (ref 150–400)
RBC: 3.59 MIL/uL — ABNORMAL LOW (ref 3.87–5.11)
RDW: 13 % (ref 11.5–15.5)
WBC: 9.8 10*3/uL (ref 4.0–10.5)
nRBC: 0 % (ref 0.0–0.2)

## 2020-04-01 NOTE — Progress Notes (Addendum)
Hospital day # 1 pregnancy at [redacted]w[redacted]d--Danielle Cherry is a 34 y.o. female, [phone], IUP at 28.1 weeks on 8/16, presenting for Vaginal bleeding in third trimester, with history of placenta previa Noted at 21 weeks, recheck in 4 weeks--NOW LOW LYING, RECHECK IN 4 WEEKS, pt endorses having intercourse 2 days ago, no other trauma, blood type A+. Depressive disorder no meds. H/O miscarriage x2, 2008, 06/2019. Latex allergy.  S:  Pt stable, in bed, verbalized unable to sleep much last night, pt was instructed again she may ask for aratax for anxiety and to help her sleep, declined at this time. Pt endorses being worried about her children at home, but verbalized understanding of importance for being monitor for her vaginal bleeding. Pt endorsed last night when she urinated she had small drop of red blood on toilet paper after urinating and this morning she noted brown discharge on the toilet paper after urinating. Pt overall endorses much improvement. Pt denies cramping, and stated she she fells her lower ba ck pain which she has had her entire pregnancy. Pt endorses +FM. Denies leakage of fluids or cxts. We discussed POC to remain in hospital for another 24 hours and reevaluate tomorrow to further monitor for vaginal bleeding. Pt verbalized consent and understanding to this and expressed eagerness to go home. Denies dysuria, vaginal discharge, irritation, abdominal pain. rashes, N, V, SI. Endorses had lower uterine pressure yesterday.   O: BP 111/67 (BP Location: Right Arm)   Pulse 72   Temp 97.9 F (36.6 C) (Oral)   Resp 18   LMP 09/16/2019   SpO2 97%       Fetal tracings: Baseline 130s, moderate variability, +acels, -decels. Reactive, Cat 1      Contractions: None        Uterus consistent with 28 weeks and non-tender      Extremities: extremities normal, atraumatic, no cyanosis or edema and no significant edema and no signs of DVT          Labs:  HGB 11.8-10.9       Meds: Atarax, tylenol, PNV, docusate    A: [redacted]w[redacted]d with vaginal bleeding in third trimester admitted to [facility] for observation, admitted 8/17. LOS# 1    Vaginal Bleeding in third trimester with low lying placenta: H/O placenta previa at 21 weeks , then low lying placenta at 27 weeks, Intercourse 2 days prior to bleed on 8/16, EBL was at home and in MAU, with x1 quarter clot and x1 dime sized clot, currently stable, last red blood was a drop last night around 11pm and currently brown only when wiping after urination. Hgb upon admission was 11.8, this morning 10.9. Blood type A+. Neg GC/C/UA on 4/12, denies dysuria or need for retesting. Plan to monitor for 24 more hours and reassess.   Chronic back pain: stable, pending UC, tylenol 650mg  PRN, heating pad for back. Plan to monitor for increased pain.   Anxiety/Depression: Stable, with mild adjustment anxiety, denies SI, no meds, discussed starting SSRI to prevent relapse of PPD in future, pt will think about it, ordered atarax 50mg  TID in pt PRN. Plant to take atarax PRN, and monitor for mood changes, possible SSRI in future.  FWB: Stable, Reactive NST,  Cat 1, last 8/9, vertex, 5.1lbs, 41% EFW, continuous monitoring. Consulted MFM for recommendation of BMZ per Dr [place], MFM recommendeds pending, message left. Plan to continuous fetal monitor for now.   Latex Allergy: Stable, no skin reactions. Plan to avoid latex usage.  DR Mora Appl aware of POC and verbalized agreement with plan.   [facility] CNM, MSN 04/01/2020 7:14 AM

## 2020-04-01 NOTE — Plan of Care (Signed)
  Problem: Education: Goal: Knowledge of General Education information will improve Description: Including pain rating scale, medication(s)/side effects and non-pharmacologic comfort measures Outcome: Completed/Met

## 2020-04-02 ENCOUNTER — Inpatient Hospital Stay (HOSPITAL_BASED_OUTPATIENT_CLINIC_OR_DEPARTMENT_OTHER): Payer: Medicaid Other

## 2020-04-02 DIAGNOSIS — O4693 Antepartum hemorrhage, unspecified, third trimester: Secondary | ICD-10-CM

## 2020-04-02 DIAGNOSIS — O4443 Low lying placenta NOS or without hemorrhage, third trimester: Secondary | ICD-10-CM | POA: Diagnosis not present

## 2020-04-02 DIAGNOSIS — Z3A28 28 weeks gestation of pregnancy: Secondary | ICD-10-CM | POA: Diagnosis not present

## 2020-04-02 LAB — URINE CULTURE: Culture: <10000 — AB

## 2020-04-02 NOTE — Plan of Care (Signed)
  Problem: Education: Goal: Knowledge of the prescribed therapeutic regimen will improve Outcome: Progressing   

## 2020-04-02 NOTE — Progress Notes (Signed)
Pt having pink vaginal discharge, slightly more than a smear,  after wiping from using the restroom.

## 2020-04-02 NOTE — Progress Notes (Signed)
Pt back from ultrasound to her room 106.

## 2020-04-02 NOTE — Progress Notes (Addendum)
Hospital day # 2 34 y.o.female, [id number] [redacted]w[redacted]d Admitted for vaginal bleeding in third trimester. Hx of placenta previa@ 21 wks. Low-lying @ 25 wks Bleeding noted after heavy household cleaning and intercourse on 8/16. Denies other abd trauma, blood type A+.Depressive disorder no meds.H/O miscarriage x2,2008 and 06/2019. Latex allergy.  S: Reports brown blood each times she wipes. Pt has seen pink discharge when wiping in the past 24 hrs. Denies abd pain, tenderness, contractions, or frank vaginal bleeding. Endorses active FM>   O: Blood pressure 113/61, pulse 90, temperature 98.4 F (36.9 C), temperature source Oral, resp. rate 16, SpO2 98 %  NST: Reactive for gest age CBC    Component Value Date/Time   WBC 9.8 04/01/2020 0448   RBC 3.59 (L) 04/01/2020 0448   HGB 10.9 (L) 04/01/2020 0448   HCT 33.7 (L) 04/01/2020 0448   PLT 180 04/01/2020 0448   MCV 93.9 04/01/2020 0448   MCH 30.4 04/01/2020 0448   MCHC 32.3 04/01/2020 0448   RDW 13.0 04/01/2020 0448   LYMPHSABS 3.9 12/13/2017 0517   MONOABS 0.6 12/13/2017 0517   EOSABS 0.2 12/13/2017 0517   BASOSABS 0.0 12/13/2017 0517   Results for JOSELINE, MCCAMPBELL (MRN [id number]) as of 04/02/2020 13:30  Ref. Range 04/01/2020 11:49  URINALYSIS, ROUTINE W REFLEX MICROSCOPIC Unknown Rpt (A)  Appearance Latest Ref Range: CLEAR  HAZY (A)  Bilirubin Urine Latest Ref Range: NEGATIVE  NEGATIVE  Color, Urine Latest Ref Range: YELLOW  YELLOW  Glucose, UA Latest Ref Range: NEGATIVE mg/dL NEGATIVE  Hgb urine dipstick Latest Ref Range: NEGATIVE  MODERATE (A)  Ketones, ur Latest Ref Range: NEGATIVE mg/dL NEGATIVE  Leukocytes,Ua Latest Ref Range: NEGATIVE  NEGATIVE  Nitrite Latest Ref Range: NEGATIVE  NEGATIVE  pH Latest Ref Range: 5.0 - 8.0  6.0  Protein Latest Ref Range: NEGATIVE mg/dL NEGATIVE  Specific Gravity, Urine Latest Ref Range: 1.005 - 1.030  1.020  Bacteria, UA Latest Ref Range: NONE SEEN  NONE SEEN  Hyaline Casts, UA Unknown PRESENT  Mucus  Unknown PRESENT  RBC / HPF Latest Ref Range: 0 - 5 RBC/hpf 0-5  Squamous Epithelial / LPF Latest Ref Range: 0 - 5  0-5  WBC, UA Latest Ref Range: 0 - 5 WBC/hpf 0-5     A/P: 34 y.o. [id number] [redacted]w[redacted]d Vaginal bleeding in 3rd trimester Low-lying placenta Cat 1  Urine culture pending Plan U/S  Will discuss exam and plan w/ Dr. Concepcion Living, MSN, CNM 04/02/2020 1:35 PM   MD ADDENDUM: I saw and examined patient at bedside and agree with above findings, assessment and plan as outlined above by Rhea Pink, MSN, CNM.  Dr. Sallye Ober. 04/02/2020.

## 2020-04-03 MED ORDER — ACETAMINOPHEN 325 MG PO TABS: 650.0000 mg | ORAL_TABLET | ORAL | Status: DC | PRN

## 2020-04-03 NOTE — Discharge Summary (Signed)
Physician Discharge Summary  Patient ID: Danielle Cherry MRN: [id number] DOB/AGE: 32-Jun-1987 32 y.o.  Admit date: 03/31/2020 Discharge date: 04/03/2020  Admission Diagnoses:  Discharge Diagnoses:  Active Problems:   Vaginal bleeding in pregnancy, third trimester   Discharged Condition: good  Hospital Course: IV hydration, ultrasound, fetal monitoring  Consults: None  Significant Diagnostic Studies: labs: urine culture negative, ultrasound  -cervical length 3.4 cm    -anterior placenta, low-lying 1.7 cm from internal os    -variable position of fetal presentation    -EFW 2# 15oz (1324 g) 60%    -normal AFI   Treatments: IV hydration and fetal monitoring  Discharge Exam: Blood pressure 116/66, pulse 86, temperature 98 F (36.7 C), temperature source Oral, resp. rate 18, last menstrual period 09/16/2019, SpO2 96 %, unknown if currently breastfeeding. General appearance: alert, cooperative and no distress GI: soft, non-tender, gravid Pelvic: deferred Extremities: no edema, redness or tenderness in the calves or thighs  Disposition: Discharge disposition: 01-Home or Self Care       Discharge Instructions    Discharge activity:   Complete by: As directed    Pelvic rest   Discharge diet:  No restrictions   Complete by: As directed    Discharge instructions   Complete by: As directed    See hadouts   Do not have sex or do anything that might make you have an orgasm   Complete by: As directed    Notify physician for a general feeling that "something is not right"   Complete by: As directed    Notify physician for increase or change in vaginal discharge   Complete by: As directed    Notify physician for intestinal cramps, with or without diarrhea, sometimes described as "gas pain"   Complete by: As directed    Notify physician for leaking of fluid   Complete by: As directed    Notify physician for low, dull backache, unrelieved by heat or Tylenol   Complete by: As  directed    Notify physician for menstrual like cramps   Complete by: As directed    Notify physician for pelvic pressure   Complete by: As directed    Notify physician for uterine contractions.  These may be painless and feel like the uterus is tightening or the baby is  "balling up"   Complete by: As directed    Notify physician for vaginal bleeding   Complete by: As directed    PRETERM LABOR:  Includes any of the follwing symptoms that occur between 20 - [redacted] weeks gestation.  If these symptoms are not stopped, preterm labor can result in preterm delivery, placing your baby at risk   Complete by: As directed      Allergies as of 04/03/2020      Reactions   Strawberry Extract Anaphylaxis   Latex Itching      Medication List    TAKE these medications   acetaminophen 325 MG tablet Commonly known as: TYLENOL Take 2 tablets (650 mg total) by mouth every 4 (four) hours as needed (for pain scale < 4  OR  temperature  >/=  100.5 F).   cetirizine 10 MG tablet Commonly known as: ZyrTEC Allergy Take 1 tablet (10 mg total) by mouth daily.   pseudoephedrine 30 MG tablet Commonly known as: SUDAFED Take 1 tablet (30 mg total) by mouth every 8 (eight) hours as needed for congestion.       Follow-up Information    [facility]. Schedule an appointment as soon as possible for a visit in 1 week(s).   Specialty: Obstetrics and Gynecology Contact information: [address] [place] [address] [phone]              Signed: Roma Schanz 04/03/2020, 6:47 PM

## 2020-04-03 NOTE — Progress Notes (Signed)
[name] #3 40 y.L.[id number] [redacted]w[redacted]d Admitted for vaginal bleeding in third trimester. Hx of placenta previa@ 21 wks. Low-lying @ 25 wks Bleeding noted after heavy household cleaning and intercourse on 8/16. Denies other abd trauma, blood type A+.Depressive disorder no meds.H/O miscarriage x2,2008 and 06/2019.Latex allergy.  S:  Reports red-tinged bleeding when wiping this AM. Abd cramping in LLQ. Denies abd tenderness, endorses active FM.   O: Blood pressure 108/68, pulse 74, temperature 98.2 F (36.8 C), temperature source Oral, resp. rate 18, SpO2 98 %  Gen: AAO x3 CV: RRR Resp: unlabored GI: abd soft, nontender, gravid GU: brown to Cherry discharge, neg for frank bleeding Ext: no pain, tenderness, edema, or cords  NST: pending for this shift, reactive last night, no ctx per Danielle Cherry  A/P: 34 y.o. [id number] [redacted]w[redacted]d Vaginal bleeding in 3rd trimester Low-lying placenta Cat 1  U/S report    -cervical length 3.4 cm    -anterior placenta, low-lying 1.7 cm from internal os    -variable position of fetal presentation    -EFW 2# 15oz (1324 g) 60%    -normal AFI   Will discuss plan w/ Dr. Mora Appl.  Danielle Pink, MSN, CNM 04/03/2020 10:08 AM

## 2020-04-03 NOTE — Progress Notes (Signed)
Pt given discharge instructions. All questions answered. Pt in stable condition with no new bleeding at this time.

## 2020-05-26 LAB — OB RESULTS CONSOLE GBS: GBS: NEGATIVE

## 2020-06-09 ENCOUNTER — Other Ambulatory Visit: Payer: Self-pay

## 2020-06-09 ENCOUNTER — Encounter (HOSPITAL_COMMUNITY): Payer: Self-pay | Admitting: Obstetrics & Gynecology

## 2020-06-09 ENCOUNTER — Inpatient Hospital Stay (HOSPITAL_COMMUNITY)
Admission: AD | Admit: 2020-06-09 | Discharge: 2020-06-11 | DRG: 798 | Disposition: A | Discharge status: Home / Self Care | Payer: Medicaid Other | Attending: Obstetrics & Gynecology | Admitting: Obstetrics & Gynecology

## 2020-06-09 DIAGNOSIS — Z87891 Personal history of nicotine dependence: Secondary | ICD-10-CM

## 2020-06-09 DIAGNOSIS — O26893 Other specified pregnancy related conditions, third trimester: Secondary | ICD-10-CM | POA: Diagnosis present

## 2020-06-09 DIAGNOSIS — Z3A38 38 weeks gestation of pregnancy: Secondary | ICD-10-CM

## 2020-06-09 DIAGNOSIS — Z20822 Contact with and (suspected) exposure to covid-19: Secondary | ICD-10-CM | POA: Diagnosis present

## 2020-06-09 DIAGNOSIS — Z9851 Tubal ligation status: Secondary | ICD-10-CM

## 2020-06-09 DIAGNOSIS — Z302 Encounter for sterilization: Secondary | ICD-10-CM

## 2020-06-09 LAB — CBC
HCT: 38.9 % (ref 36.0–46.0)
Hemoglobin: 12.4 g/dL (ref 12.0–15.0)
MCH: 29.6 pg (ref 26.0–34.0)
MCHC: 31.9 g/dL (ref 30.0–36.0)
MCV: 92.8 fL (ref 80.0–100.0)
Platelets: 190 10*3/uL (ref 150–400)
RBC: 4.19 MIL/uL (ref 3.87–5.11)
RDW: 13.6 % (ref 11.5–15.5)
WBC: 9.7 10*3/uL (ref 4.0–10.5)
nRBC: 0 % (ref 0.0–0.2)

## 2020-06-09 LAB — TYPE AND SCREEN
ABO/RH(D): A POS
Antibody Screen: NEGATIVE

## 2020-06-09 LAB — RESPIRATORY PANEL BY RT PCR (FLU A&B, COVID)
Influenza A by PCR: NEGATIVE
Influenza B by PCR: NEGATIVE
SARS Coronavirus 2 by RT PCR: NEGATIVE

## 2020-06-09 LAB — POCT FERN TEST: POCT Fern Test: POSITIVE — AB

## 2020-06-09 MED ORDER — PRENATAL MULTIVITAMIN CH: 1.0000 | ORAL_TABLET | Freq: Every day | ORAL | Status: DC

## 2020-06-09 MED ORDER — ZOLPIDEM TARTRATE 5 MG PO TABS: 5.0000 mg | ORAL_TABLET | Freq: Every evening | ORAL | Status: DC | PRN

## 2020-06-09 MED ORDER — WITCH HAZEL-GLYCERIN EX PADS
1.0000 "application " | MEDICATED_PAD | CUTANEOUS | Status: DC | PRN
  Filled 2020-06-09: qty 100

## 2020-06-09 MED ORDER — OXYTOCIN-SODIUM CHLORIDE 30-0.9 UT/500ML-% IV SOLN: 2.5000 [IU]/h | INTRAVENOUS | Status: DC

## 2020-06-09 MED ORDER — LIDOCAINE HCL (PF) 1 % IJ SOLN: 30.0000 mL | INTRAMUSCULAR | Status: DC | PRN

## 2020-06-09 MED ORDER — DIPHENHYDRAMINE HCL 25 MG PO CAPS: 25.0000 mg | ORAL_CAPSULE | Freq: Four times a day (QID) | ORAL | Status: DC | PRN

## 2020-06-09 MED ORDER — OXYCODONE HCL 5 MG PO TABS
10.0000 mg | ORAL_TABLET | ORAL | Status: DC | PRN
  Administered 2020-06-10 – 2020-06-11 (×4): 10 mg via ORAL
  Filled 2020-06-09 (×4): qty 2

## 2020-06-09 MED ORDER — OXYCODONE-ACETAMINOPHEN 5-325 MG PO TABS: 2.0000 | ORAL_TABLET | ORAL | Status: DC | PRN

## 2020-06-09 MED ORDER — SENNOSIDES-DOCUSATE SODIUM 8.6-50 MG PO TABS
2.0000 | ORAL_TABLET | ORAL | Status: DC
  Administered 2020-06-10 (×2): 2 via ORAL
  Filled 2020-06-09 (×2): qty 2

## 2020-06-09 MED ORDER — TERBUTALINE SULFATE 1 MG/ML IJ SOLN: 0.2500 mg | Freq: Once | INTRAMUSCULAR | Status: DC | PRN

## 2020-06-09 MED ORDER — OXYCODONE HCL 5 MG PO TABS
5.0000 mg | ORAL_TABLET | ORAL | Status: DC | PRN
  Administered 2020-06-10 (×2): 5 mg via ORAL
  Filled 2020-06-09 (×2): qty 1

## 2020-06-09 MED ORDER — OXYTOCIN BOLUS FROM INFUSION
333.0000 mL | Freq: Once | INTRAVENOUS | Status: AC
  Administered 2020-06-09: 333 mL via INTRAVENOUS

## 2020-06-09 MED ORDER — SIMETHICONE 80 MG PO CHEW
80.0000 mg | CHEWABLE_TABLET | ORAL | Status: DC | PRN
  Administered 2020-06-10 – 2020-06-11 (×2): 80 mg via ORAL
  Filled 2020-06-09 (×2): qty 1

## 2020-06-09 MED ORDER — SOD CITRATE-CITRIC ACID 500-334 MG/5ML PO SOLN
30.0000 mL | ORAL | Status: DC | PRN
  Administered 2020-06-09: 30 mL via ORAL
  Filled 2020-06-09: qty 15

## 2020-06-09 MED ORDER — LACTATED RINGERS IV SOLN: 500.0000 mL | INTRAVENOUS | Status: DC | PRN

## 2020-06-09 MED ORDER — ONDANSETRON HCL 4 MG/2ML IJ SOLN: 4.0000 mg | INTRAMUSCULAR | Status: DC | PRN

## 2020-06-09 MED ORDER — ONDANSETRON HCL 4 MG/2ML IJ SOLN: 4.0000 mg | Freq: Four times a day (QID) | INTRAMUSCULAR | Status: DC | PRN

## 2020-06-09 MED ORDER — ONDANSETRON HCL 4 MG PO TABS: 4.0000 mg | ORAL_TABLET | ORAL | Status: DC | PRN

## 2020-06-09 MED ORDER — DIBUCAINE (PERIANAL) 1 % EX OINT: 1.0000 "application " | TOPICAL_OINTMENT | CUTANEOUS | Status: DC | PRN

## 2020-06-09 MED ORDER — LACTATED RINGERS IV SOLN: INTRAVENOUS | Status: DC

## 2020-06-09 MED ORDER — ACETAMINOPHEN 325 MG PO TABS
650.0000 mg | ORAL_TABLET | ORAL | Status: DC | PRN
  Administered 2020-06-10 – 2020-06-11 (×5): 650 mg via ORAL
  Filled 2020-06-09 (×5): qty 2

## 2020-06-09 MED ORDER — FLEET ENEMA 7-19 GM/118ML RE ENEM: 1.0000 | ENEMA | RECTAL | Status: DC | PRN

## 2020-06-09 MED ORDER — ACETAMINOPHEN 325 MG PO TABS: 650.0000 mg | ORAL_TABLET | ORAL | Status: DC | PRN

## 2020-06-09 MED ORDER — OXYCODONE-ACETAMINOPHEN 5-325 MG PO TABS: 1.0000 | ORAL_TABLET | ORAL | Status: DC | PRN

## 2020-06-09 MED ORDER — OXYTOCIN-SODIUM CHLORIDE 30-0.9 UT/500ML-% IV SOLN
1.0000 m[IU]/min | INTRAVENOUS | Status: DC
  Administered 2020-06-09: 2 m[IU]/min via INTRAVENOUS
  Filled 2020-06-09: qty 500

## 2020-06-09 MED ORDER — BENZOCAINE-MENTHOL 20-0.5 % EX AERO: 1.0000 "application " | INHALATION_SPRAY | CUTANEOUS | Status: DC | PRN

## 2020-06-09 MED ORDER — COCONUT OIL OIL: 1.0000 "application " | TOPICAL_OIL | Status: DC | PRN

## 2020-06-09 NOTE — H&P (Addendum)
Danielle Cherry is a 34 y.o. female [phone] at 38 weeks 1 day EGA by LMP, consistent with first trimester US, presenting for leakage of fluid at 7.30 am this morning.  She reports feeling mild contractions.  Rupture of membranes was confirmed by positive fern test and gross leakage of fluid.      OB History    Gravida  6   Para  3   Term  3   Preterm      AB  2   Living  3     SAB  2   TAB      Ectopic      Multiple  0   Live Births  3        Obstetric Comments  Bled after delivery of daughter, "had to get a shot"       Past Medical History:  Diagnosis Date  . Depression    took meds briefly, going to therapy, doing good  . No pertinent past medical history    Past Surgical History:  Procedure Laterality Date  . DILATION AND EVACUATION N/A 07/06/2019   Procedure: DILATATION AND EVACUATION;  Surgeon: Jaymes Graffillard, Naima, MD;  Location: [facility];  Service: Gynecology;  Laterality: N/A;  Ultrasound Guidance  . NO PAST SURGERIES    . OPERATIVE ULTRASOUND N/A 07/06/2019   Procedure: OPERATIVE ULTRASOUND;  Surgeon: Jaymes Graffillard, Naima, MD;  Location: [facility];  Service: Gynecology;  Laterality: N/A;   Family History: family history includes Cancer in her paternal grandfather; Diabetes in her mother; Hyperlipidemia in her father; Hypertension in her mother; Parkinson's disease in her paternal grandmother. Social History:  reports that she has quit smoking. Her smoking use included cigarettes. She has never used smokeless tobacco. She reports previous alcohol use of about 1.0 standard drink of alcohol per week. She reports that she does not use drugs.     Maternal Diabetes: No Genetic Screening: Normal Maternal Ultrasounds/Referrals: Normal anatomy ultrasound.  Low lying placenta resolved in third trimester.  Fetal Ultrasounds or other Referrals:  None Maternal Substance Abuse:  No Significant Maternal Medications:  None Significant Maternal Lab  Results:  Group B Strep negative Other Comments:  None  Review of Systems History Dilation: 2 Effacement (%): 70, 80 Station: -2 Exam by:: Latricia HeftAnna Cioce, RN Blood pressure 127/83, pulse 93, temperature 98.3 F (36.8 C), resp. rate 19, height 4\' 11"  (1.499 m), weight 81.5 kg, last menstrual period 09/16/2019, unknown if currently breastfeeding. Exam Physical Exam Blood pressure 120/74, pulse 80, temperature 97.8 F (36.6 C), temperature source Oral, resp. rate 19, height 4\' 11"  (1.499 m), weight 81.5 kg, last menstrual period 09/16/2019, unknown if currently breastfeeding. Constitutional: She is oriented to person, place, and time. She appears well-developed and well-nourished.  HENT:  Head: Normocephalic and atraumatic.  Neck: Normal range of motion.  Cardiovascular: Normal rate, regular rhythm and normal heart sounds.   Respiratory: Effort normal and breath sounds normal.  GI: Soft. Bowel sounds are normal.  Neurological: She is alert and oriented to person, place, and time.  Skin: Skin is warm and dry.  Psychiatric: She has a normal mood and affect. Her behavior is normal.  Prenatal labs: ABO, Rh: --/--/A POS (08/16 1904) Antibody: NEG (08/16 1904) Rubella:  Immune RPR:   NR HBsAg:   Neg HIV:   Nonreactive GBS:  Neg   Recent Results (from the past 2160 hour(s))  Type and screen  [facility]     Status: None  Collection Time: 03/31/20  7:04 PM  Result Value Ref Range   ABO/RH(D) A POS    Antibody Screen NEG    Sample Expiration      04/03/2020,2359 Performed at [facility], [address] [place], [place] [address]   CBC on admission     Status: Abnormal   Collection Time: 03/31/20  7:04 PM  Result Value Ref Range   WBC 11.9 (H) 4.0 - 10.5 K/uL   RBC 3.87 3.87 - 5.11 MIL/uL   Hemoglobin 11.8 (L) 12.0 - 15.0 g/dL   HCT 85.8 (L) 36 - 46 %   MCV 92.8 80.0 - 100.0 fL   MCH 30.5 26.0 - 34.0 pg   MCHC 32.9 30.0 - 36.0 g/dL   RDW 85.0 27.7 - 41.2  %   Platelets 184 150 - 400 K/uL   nRBC 0.0 0.0 - 0.2 %    Comment: Performed at [facility], [address] [place], [place] [address]  SARS Coronavirus 2 by RT PCR (hospital order, performed in [facility] hospital lab) Nasopharyngeal Nasopharyngeal Swab     Status: None   Collection Time: 03/31/20  7:31 PM   Specimen: Nasopharyngeal Swab  Result Value Ref Range   SARS Coronavirus 2 NEGATIVE NEGATIVE    Comment: (NOTE) SARS-CoV-2 target nucleic acids are NOT DETECTED.  The SARS-CoV-2 RNA is generally detectable in upper and lower respiratory specimens during the acute phase of infection. The lowest concentration of SARS-CoV-2 viral copies this assay can detect is 250 copies / mL. A negative result does not preclude SARS-CoV-2 infection and should not be used as the sole basis for treatment or other patient management decisions.  A negative result may occur with improper specimen collection / handling, submission of specimen other than nasopharyngeal swab, presence of viral mutation(s) within the areas targeted by this assay, and inadequate number of viral copies (<250 copies / mL). A negative result must be combined with clinical observations, patient history, and epidemiological information.  Fact Sheet for Patients:   BoilerBrush.com.cy  Fact Sheet for Healthcare Providers: https://pope.com/  This test is not yet approved or  cleared by the [place] FDA and has been authorized for detection and/or diagnosis of SARS-CoV-2 by FDA under an Emergency Use Authorization (EUA).  This EUA will remain in effect (meaning this test can be used) for the duration of the COVID-19 declaration under Section 564(b)(1) of the Act, 21 U.S.C. section 360bbb-3(b)(1), unless the authorization is terminated or revoked sooner.  Performed at [facility], [address], [place], [place] [address]   CBC     Status: Abnormal    Collection Time: 04/01/20  4:48 AM  Result Value Ref Range   WBC 9.8 4.0 - 10.5 K/uL   RBC 3.59 (L) 3.87 - 5.11 MIL/uL   Hemoglobin 10.9 (L) 12.0 - 15.0 g/dL   HCT 47.0 (L) 36 - 46 %   MCV 93.9 80.0 - 100.0 fL   MCH 30.4 26.0 - 34.0 pg   MCHC 32.3 30.0 - 36.0 g/dL   RDW 96.2 83.6 - 62.9 %   Platelets 180 150 - 400 K/uL   nRBC 0.0 0.0 - 0.2 %    Comment: Performed at [facility], [address] [place], [place] [address]  Urinalysis, Routine w reflex microscopic Urine, Clean Catch     Status: Abnormal   Collection Time: 04/01/20 11:49 AM  Result Value Ref Range   Color, Urine YELLOW  YELLOW   APPearance HAZY (A) CLEAR   Specific Gravity, Urine 1.020 1.005 - 1.030   pH 6.0 5.0 - 8.0   Glucose, UA NEGATIVE NEGATIVE mg/dL   Hgb urine dipstick MODERATE (A) NEGATIVE   Bilirubin Urine NEGATIVE NEGATIVE   Ketones, ur NEGATIVE NEGATIVE mg/dL   Protein, ur NEGATIVE NEGATIVE mg/dL   Nitrite NEGATIVE NEGATIVE   Leukocytes,Ua NEGATIVE NEGATIVE   RBC / HPF 0-5 0 - 5 RBC/hpf   WBC, UA 0-5 0 - 5 WBC/hpf   Bacteria, UA NONE SEEN NONE SEEN   Squamous Epithelial / LPF 0-5 0 - 5   Mucus PRESENT    Hyaline Casts, UA PRESENT     Comment: Performed at [facility], [address], [place], [place] [address]  Urine culture     Status: Abnormal   Collection Time: 04/01/20 11:49 AM   Specimen: Urine, Clean Catch  Result Value Ref Range   Specimen Description URINE, CLEAN CATCH    Special Requests NONE    Culture (A)     <10,000 COLONIES/mL INSIGNIFICANT GROWTH Performed at [facility], [address] [place], [place] [address]    Report Status 04/02/2020 FINAL   OB RESULT CONSOLE Group B Strep     Status: None   Collection Time: 05/26/20 12:00 AM  Result Value Ref Range   GBS Negative   CBC     Status: None   Collection Time: 06/09/20  9:43 AM  Result Value Ref Range   WBC 9.7 4.0 - 10.5 K/uL   RBC 4.19 3.87 - 5.11 MIL/uL   Hemoglobin 12.4 12.0 - 15.0 g/dL   HCT 82.9  36 - 46 %   MCV 92.8 80.0 - 100.0 fL   MCH 29.6 26.0 - 34.0 pg   MCHC 31.9 30.0 - 36.0 g/dL   RDW 56.2 13.0 - 86.5 %   Platelets 190 150 - 400 K/uL   nRBC 0.0 0.0 - 0.2 %    Comment: Performed at [facility], [address], [place], [place] [address]  Type and screen [facility]     Status: None   Collection Time: 06/09/20  9:43 AM  Result Value Ref Range   ABO/RH(D) A POS    Antibody Screen NEG    Sample Expiration      06/12/2020,2359 Performed at [facility], [address], [place], [place] [address]   Respiratory Panel by RT PCR (Flu A&B, Covid) - Nasopharyngeal Swab     Status: None   Collection Time: 06/09/20  9:43 AM   Specimen: Nasopharyngeal Swab  Result Value Ref Range   SARS Coronavirus 2 by RT PCR NEGATIVE NEGATIVE    Comment: (NOTE) SARS-CoV-2 target nucleic acids are NOT DETECTED.  The SARS-CoV-2 RNA is generally detectable in upper respiratoy specimens during the acute phase of infection. The lowest concentration of SARS-CoV-2 viral copies this assay can detect is 131 copies/mL. A negative result does not preclude SARS-Cov-2 infection and should not be used as the sole basis for treatment or other patient management decisions. A negative result may occur with  improper specimen collection/handling, submission of specimen other than nasopharyngeal swab, presence of viral mutation(s) within the areas targeted by this assay, and inadequate number of viral copies (<131 copies/mL). A negative result must be combined with clinical observations, patient history, and epidemiological information. The expected result is Negative.  Fact Sheet for Patients:  https://www.moore.com/  Fact Sheet for Healthcare  Providers:  https://www.young.biz/  This test is no t yet approved or cleared by the [place] and  has been authorized for detection and/or diagnosis of SARS-CoV-2 by FDA under an  Emergency Use Authorization (EUA). This EUA will remain  in effect (meaning this test can be used) for the duration of the COVID-19 declaration under Section 564(b)(1) of the Act, 21 U.S.C. section 360bbb-3(b)(1), unless the authorization is terminated or revoked sooner.     Influenza A by PCR NEGATIVE NEGATIVE   Influenza B by PCR NEGATIVE NEGATIVE    Comment: (NOTE) The Xpert Xpress SARS-CoV-2/FLU/RSV assay is intended as an aid in  the diagnosis of influenza from Nasopharyngeal swab specimens and  should not be used as a sole basis for treatment. Nasal washings and  aspirates are unacceptable for Xpert Xpress SARS-CoV-2/FLU/RSV  testing.  Fact Sheet for Patients: https://www.moore.com/  Fact Sheet for Healthcare Providers: https://www.young.biz/  This test is not yet approved or cleared by the [place] FDA and  has been authorized for detection and/or diagnosis of SARS-CoV-2 by  FDA under an Emergency Use Authorization (EUA). This EUA will remain  in effect (meaning this test can be used) for the duration of the  Covid-19 declaration under Section 564(b)(1) of the Act, 21  U.S.C. section 360bbb-3(b)(1), unless the authorization is  terminated or revoked. Performed at [facility], [address], [place], [place] [address]   Crist Fat Test     Status: Abnormal   Collection Time: 06/09/20 10:23 AM  Result Value Ref Range   POCT Fern Test Positive = ruptured amniotic membanes (A)     Assessment/Plan: 34 y/o [id number] at 38 weeks 1 day EGA here for spontaneous rupture of membranes. - Admit to labor and delivery. - Routine admit orders.  - Reasess cervix in 3 hours and if unchanged plan for pitocin labor augmentation.  - Patient desires postpartum bilateral tubal ligation, consent form signed in office.   Konrad Felix, MD.  06/09/2020, 10:05 AM

## 2020-06-09 NOTE — MAU Note (Signed)
Noted wetness around 7, clear fluid.  Small amt of mucous with blood in it.  Just started to have some cramping.  Was 1/70 when last checked.

## 2020-06-10 ENCOUNTER — Inpatient Hospital Stay (HOSPITAL_COMMUNITY): Payer: Medicaid Other | Admitting: Certified Registered Nurse Anesthetist

## 2020-06-10 ENCOUNTER — Encounter (HOSPITAL_COMMUNITY)
Admission: AD | Disposition: A | Discharge status: Home / Self Care | Payer: Self-pay | Source: Home / Self Care | Attending: Obstetrics & Gynecology

## 2020-06-10 ENCOUNTER — Encounter (HOSPITAL_COMMUNITY): Payer: Self-pay | Admitting: Obstetrics & Gynecology

## 2020-06-10 DIAGNOSIS — Z9851 Tubal ligation status: Secondary | ICD-10-CM

## 2020-06-10 HISTORY — PX: TUBAL LIGATION: SHX77

## 2020-06-10 LAB — CBC
HCT: 36.5 % (ref 36.0–46.0)
Hemoglobin: 11.8 g/dL — ABNORMAL LOW (ref 12.0–15.0)
MCH: 29.4 pg (ref 26.0–34.0)
MCHC: 32.3 g/dL (ref 30.0–36.0)
MCV: 91 fL (ref 80.0–100.0)
Platelets: 156 10*3/uL (ref 150–400)
RBC: 4.01 MIL/uL (ref 3.87–5.11)
RDW: 13.5 % (ref 11.5–15.5)
WBC: 13.4 10*3/uL — ABNORMAL HIGH (ref 4.0–10.5)
nRBC: 0 % (ref 0.0–0.2)

## 2020-06-10 LAB — RPR: RPR Ser Ql: NONREACTIVE

## 2020-06-10 SURGERY — LIGATION, FALLOPIAN TUBE, POSTPARTUM
Anesthesia: General

## 2020-06-10 MED ORDER — DEXAMETHASONE SODIUM PHOSPHATE 4 MG/ML IJ SOLN
INTRAMUSCULAR | Status: DC | PRN
  Administered 2020-06-10: 5 mg via INTRAVENOUS

## 2020-06-10 MED ORDER — SUCCINYLCHOLINE CHLORIDE 200 MG/10ML IV SOSY
PREFILLED_SYRINGE | INTRAVENOUS | Status: AC
  Filled 2020-06-10: qty 10

## 2020-06-10 MED ORDER — EPHEDRINE SULFATE 50 MG/ML IJ SOLN
INTRAMUSCULAR | Status: DC | PRN
  Administered 2020-06-10: 10 mg via INTRAVENOUS

## 2020-06-10 MED ORDER — FENTANYL CITRATE (PF) 100 MCG/2ML IJ SOLN
INTRAMUSCULAR | Status: AC
  Filled 2020-06-10: qty 2

## 2020-06-10 MED ORDER — PROPOFOL 10 MG/ML IV BOLUS
INTRAVENOUS | Status: AC
  Filled 2020-06-10: qty 20

## 2020-06-10 MED ORDER — METOCLOPRAMIDE HCL 10 MG PO TABS
10.0000 mg | ORAL_TABLET | Freq: Once | ORAL | Status: AC
  Administered 2020-06-10: 10 mg via ORAL
  Filled 2020-06-10: qty 1

## 2020-06-10 MED ORDER — BUPIVACAINE HCL (PF) 0.25 % IJ SOLN
INTRAMUSCULAR | Status: AC
  Filled 2020-06-10: qty 30

## 2020-06-10 MED ORDER — LIDOCAINE 2% (20 MG/ML) 5 ML SYRINGE
INTRAMUSCULAR | Status: AC
  Filled 2020-06-10: qty 5

## 2020-06-10 MED ORDER — FENTANYL CITRATE (PF) 100 MCG/2ML IJ SOLN
INTRAMUSCULAR | Status: DC | PRN
  Administered 2020-06-10 (×5): 50 ug via INTRAVENOUS

## 2020-06-10 MED ORDER — FENTANYL CITRATE (PF) 100 MCG/2ML IJ SOLN: 25.0000 ug | INTRAMUSCULAR | Status: DC | PRN

## 2020-06-10 MED ORDER — SUGAMMADEX SODIUM 200 MG/2ML IV SOLN
INTRAVENOUS | Status: DC | PRN
  Administered 2020-06-10: 200 mg via INTRAVENOUS

## 2020-06-10 MED ORDER — KETOROLAC TROMETHAMINE 30 MG/ML IJ SOLN
INTRAMUSCULAR | Status: AC
  Filled 2020-06-10: qty 1

## 2020-06-10 MED ORDER — FAMOTIDINE 20 MG PO TABS
40.0000 mg | ORAL_TABLET | Freq: Once | ORAL | Status: AC
  Administered 2020-06-10: 40 mg via ORAL
  Filled 2020-06-10: qty 2

## 2020-06-10 MED ORDER — LACTATED RINGERS IV SOLN: INTRAVENOUS | Status: DC

## 2020-06-10 MED ORDER — ROCURONIUM BROMIDE 100 MG/10ML IV SOLN
INTRAVENOUS | Status: DC | PRN
  Administered 2020-06-10: 5 mg via INTRAVENOUS
  Administered 2020-06-10: 40 mg via INTRAVENOUS

## 2020-06-10 MED ORDER — ACETAMINOPHEN 10 MG/ML IV SOLN
INTRAVENOUS | Status: AC
  Filled 2020-06-10: qty 100

## 2020-06-10 MED ORDER — BUPIVACAINE HCL (PF) 0.25 % IJ SOLN
INTRAMUSCULAR | Status: DC | PRN
  Administered 2020-06-10: 6 mL
  Administered 2020-06-10: 4 mL

## 2020-06-10 MED ORDER — ONDANSETRON HCL 4 MG/2ML IJ SOLN
INTRAMUSCULAR | Status: AC
  Filled 2020-06-10: qty 2

## 2020-06-10 MED ORDER — PROPOFOL 10 MG/ML IV BOLUS
INTRAVENOUS | Status: DC | PRN
  Administered 2020-06-10: 150 mg via INTRAVENOUS
  Administered 2020-06-10: 50 mg via INTRAVENOUS

## 2020-06-10 MED ORDER — LIDOCAINE HCL (CARDIAC) PF 100 MG/5ML IV SOSY
PREFILLED_SYRINGE | INTRAVENOUS | Status: DC | PRN
  Administered 2020-06-10: 60 mg via INTRAVENOUS

## 2020-06-10 MED ORDER — MIDAZOLAM HCL 2 MG/2ML IJ SOLN
INTRAMUSCULAR | Status: AC
  Filled 2020-06-10: qty 2

## 2020-06-10 MED ORDER — SODIUM CHLORIDE 0.9 % IR SOLN
Status: DC | PRN
  Administered 2020-06-10: 1

## 2020-06-10 MED ORDER — KETOROLAC TROMETHAMINE 30 MG/ML IJ SOLN
INTRAMUSCULAR | Status: DC | PRN
  Administered 2020-06-10: 30 mg via INTRAVENOUS

## 2020-06-10 MED ORDER — ROCURONIUM BROMIDE 10 MG/ML (PF) SYRINGE
PREFILLED_SYRINGE | INTRAVENOUS | Status: AC
  Filled 2020-06-10: qty 10

## 2020-06-10 MED ORDER — SUCCINYLCHOLINE CHLORIDE 20 MG/ML IJ SOLN
INTRAMUSCULAR | Status: DC | PRN
  Administered 2020-06-10: 180 mg via INTRAVENOUS

## 2020-06-10 MED ORDER — ACETAMINOPHEN 10 MG/ML IV SOLN
1000.0000 mg | Freq: Once | INTRAVENOUS | Status: DC | PRN
  Administered 2020-06-10: 1000 mg via INTRAVENOUS

## 2020-06-10 MED ORDER — ONDANSETRON HCL 4 MG/2ML IJ SOLN
INTRAMUSCULAR | Status: DC | PRN
  Administered 2020-06-10: 4 mg via INTRAVENOUS

## 2020-06-10 MED ORDER — DEXAMETHASONE SODIUM PHOSPHATE 10 MG/ML IJ SOLN
INTRAMUSCULAR | Status: AC
  Filled 2020-06-10: qty 1

## 2020-06-10 MED ORDER — EPHEDRINE 5 MG/ML INJ
INTRAVENOUS | Status: AC
  Filled 2020-06-10: qty 10

## 2020-06-10 MED ORDER — MIDAZOLAM HCL 5 MG/5ML IJ SOLN
INTRAMUSCULAR | Status: DC | PRN
  Administered 2020-06-10: 2 mg via INTRAVENOUS

## 2020-06-10 SURGICAL SUPPLY — 29 items
ADH SKN CLS APL DERMABOND .7 (GAUZE/BANDAGES/DRESSINGS) ×1
CLOTH BEACON ORANGE TIMEOUT ST (SAFETY) ×3 IMPLANT
DERMABOND ADVANCED (GAUZE/BANDAGES/DRESSINGS) ×2
DERMABOND ADVANCED .7 DNX12 (GAUZE/BANDAGES/DRESSINGS) ×1 IMPLANT
DRSG OPSITE 4X5.5 SM (GAUZE/BANDAGES/DRESSINGS) ×2 IMPLANT
DRSG OPSITE POSTOP 3X4 (GAUZE/BANDAGES/DRESSINGS) ×3 IMPLANT
DURAPREP 26ML APPLICATOR (WOUND CARE) ×3 IMPLANT
ELECT REM PT RETURN 9FT ADLT (ELECTROSURGICAL) ×3
ELECTRODE REM PT RTRN 9FT ADLT (ELECTROSURGICAL) ×1 IMPLANT
GLOVE BIO SURGEON STRL SZ 6.5 (GLOVE) ×2 IMPLANT
GLOVE BIO SURGEONS STRL SZ 6.5 (GLOVE) ×1
GLOVE BIOGEL PI IND STRL 7.0 (GLOVE) ×3 IMPLANT
GLOVE BIOGEL PI INDICATOR 7.0 (GLOVE) ×6
GOWN STRL REUS W/TWL LRG LVL3 (GOWN DISPOSABLE) ×6 IMPLANT
NEEDLE HYPO 22GX1.5 SAFETY (NEEDLE) ×3 IMPLANT
NS IRRIG 1000ML POUR BTL (IV SOLUTION) ×3 IMPLANT
PACK ABDOMINAL MINOR (CUSTOM PROCEDURE TRAY) ×3 IMPLANT
PENCIL BUTTON HOLSTER BLD 10FT (ELECTRODE) ×3 IMPLANT
PROTECTOR NERVE ULNAR (MISCELLANEOUS) ×3 IMPLANT
SPONGE LAP 4X18 RFD (DISPOSABLE) ×3 IMPLANT
SUT MON AB 4-0 PS1 27 (SUTURE) ×3 IMPLANT
SUT PLAIN 0 NONE (SUTURE) ×2 IMPLANT
SUT VIC AB 2-0 CT1 27 (SUTURE) ×3
SUT VIC AB 2-0 CT1 TAPERPNT 27 (SUTURE) IMPLANT
SUT VICRYL 0 UR6 27IN ABS (SUTURE) ×3 IMPLANT
SYR CONTROL 10ML LL (SYRINGE) ×3 IMPLANT
TOWEL OR 17X24 6PK STRL BLUE (TOWEL DISPOSABLE) ×6 IMPLANT
TRAY FOLEY CATH SILVER 14FR (SET/KITS/TRAYS/PACK) ×3 IMPLANT
WATER STERILE IRR 1000ML POUR (IV SOLUTION) ×3 IMPLANT

## 2020-06-10 NOTE — Anesthesia Postprocedure Evaluation (Signed)
Anesthesia Post Note  Patient: Danielle Cherry  Procedure(s) Performed: POST PARTUM TUBAL LIGATION (N/A )     Patient location during evaluation: PACU Anesthesia Type: General Level of consciousness: awake and alert Pain management: pain level controlled Vital Signs Assessment: post-procedure vital signs reviewed and stable Respiratory status: spontaneous breathing, nonlabored ventilation, respiratory function stable and patient connected to nasal cannula oxygen Cardiovascular status: blood pressure returned to baseline and stable Postop Assessment: no apparent nausea or vomiting Anesthetic complications: no   No complications documented.  Last Vitals:  Vitals:   06/10/20 1415 06/10/20 1440  BP: 118/72 122/72  Pulse: 72   Resp: 18 18  Temp:  36.9 C  SpO2: 97%     Last Pain:  Vitals:   06/10/20 1540  TempSrc:   PainSc: 7    Pain Goal:                   Marylou Wages L Layann Bluett

## 2020-06-10 NOTE — Social Work (Addendum)
CSW received consult for hx of Depression.  CSW met with MOB to offer support and complete assessment.     CSW introduced self and role. CSW observed FOB bedside and baby in basinet. MOB declined having FOB leave for privacy. CSW informed MOB of reason for consult, MOB expressed understanding. MOB disclosed she has a history of depression during her pregnancy in 2019. MOB stated the depression was mild and she did not take medication or go to therapy. MOB expressed she coped by practicing positive thoughts. MOB stated she did not experience any depressive symptoms this pregnancy. MOB denies any SI or HI.  CSW provided education regarding the baby blues period vs. perinatal mood disorders, discussed treatment and provided resources for mental health follow up if concerns arise. CSW recommends self-evaluation during the postpartum time period using the New Mom Checklist from Postpartum Progress and encouraged MOB to contact a medical professional if symptoms are noted at any time. MOB stated she did not experience any PPD with her last child.   CSW provided review of Sudden Infant Death Syndrome (SIDS) precautions.  MOB stated baby will sleep in a basinet. MOB expressed they have all of the essential needs for baby to discharge home, including a new carseat. [facility] will [facility], MOB denies any transportation barriers.   MOB denies any additional questions or concerns. CSW identifies no further need for intervention and no barriers to discharge at this time.  Darra Lis, [place] [employer] [phone]

## 2020-06-10 NOTE — Op Note (Signed)
OPERATIVE NOTE  Danielle Cherry  DOB:    26-Jun-1986  MRN:    [id number]  CSN:    [id number]  Date of Surgery:  06/10/2020   Preoperative Diagnosis: Desired Sterilization  Postoperative Diagnosis: Same as above  Procedure:  Post partum tubal ligation via Modified Pomeroy method  Surgeon: Delila Spence. Mora Appl, M.D.  Assistant: None  Anesthetic: General endotracheal, local    Fluids: 500 mL EBL: 5 mL Complications: None  Indication:  Ms. Hentz is a 34 y.o. year old female,who desires permanent sterilization. Risks, benefits, alternatives and indication was explained to patient prior to the procedure.   Findings:  After adequate general anesthesia was performed the patient was prepped and draped in the usual sterile fashion.  A small subumbilical incision was performed using a 15 blade scalpel. The fascia was entered sharply using Mayo scissors and the fascia tagged with 0-vicryl on a UR-6 needle. The peritoneum identified and easily entered. The fundus of the uterus was easily located with the surgeon's fingers through the incision and the right fallopian tube was swept into view so that the midportion of the tube could be grasped with Babcock clamps and delivered. As the loop was held up, the fallopian tube was traced down to the fimbriated end to ensure that this was the fallopian tube. The midportion of the fallopian tube was held with the Babcock clamp and a window into the mesosalpinx was made with the bovie. The tube was clamped with Kelly clamps and ligated twice with two free tie sutures of 0-plain catgut. The loop was cut away with Metzenbaum scissors and hemostasis was performed using electrocautery.  The tubal segment was handed off to be sent to Pathology. The process was repeated on the opposite side, grasping the midportion of the left fallopian tube.The midportion again was held with a Babcock clamp, the mesosalpinx entered with bovie cautery and the tube ligated with 0-plain  gut. The segment of tube was sequentially clamped with Kelly clamps and was cut away with Metzenbaum scissors and the pedicles ligated with 0-plain gut. The pedicle ends cauterized with the bovie. The tubal pedicles were replaced in the abdomen in anatomical position. The fascia was reapproximated with the already tagged 0-vicryl.  The skin was reapproximated with subcuticular stitch of 4-0 monocryl. The patient's incision was cleaned and dermabond was placed.   The patient tolerated the procedure well and was sent to the PACU in good condition. All instrument, sponge and needle counts were correct x 2  Melanie Openshaw STACIA

## 2020-06-10 NOTE — Transfer of Care (Signed)
Immediate Anesthesia Transfer of Care Note  Patient: Arliss Journey  Procedure(s) Performed: POST PARTUM TUBAL LIGATION (N/A )  Patient Location: PACU  Anesthesia Type:General  Level of Consciousness: awake, alert  and oriented  Airway & Oxygen Therapy: Patient Spontanous Breathing and Patient connected to nasal cannula oxygen  Post-op Assessment: Report given to RN and Post -op Vital signs reviewed and stable  Post vital signs: Reviewed and stable  Last Vitals:  Vitals Value Taken Time  BP 117/69 06/10/20 1318  Temp    Pulse 89 06/10/20 1321  Resp 16 06/10/20 1321  SpO2 99 % 06/10/20 1321  Vitals shown include unvalidated device data.  Last Pain:  Vitals:   06/10/20 0905  TempSrc: Oral  PainSc: 3          Complications: No complications documented.

## 2020-06-10 NOTE — Progress Notes (Signed)
PPD# 1 SVD w/ intact Information for the patient's newborn:  Danielle, Cherry [[id number]]  female    Baby Name Renea Ee  Circumcision N/A   S:   Reports feeling good Tolerating PO fluid and solids No nausea or vomiting Bleeding is moderate Pain controlled with PO meds Up ad lib / ambulatory / voiding w/o difficulty Feeding: Bottle    O:   VS: BP 116/72 (BP Location: Right Arm)   Pulse 80   Temp 98.4 F (36.9 C) (Oral)   Resp 18   Ht 4\' 11"  (1.499 m)   Wt 81.5 kg   LMP 09/16/2019   SpO2 100%   Breastfeeding Unknown   BMI 36.30 kg/m   LABS:  Recent Labs    06/09/20 0943 06/10/20 0531  WBC 9.7 13.4*  HGB 12.4 11.8*  PLT 190 156   Blood type: --/--/A POS (10/25 0943) Rubella:                        I&O: Intake/Output      10/25 0701 - 10/26 0700 10/26 0701 - 10/27 0700   I.V. (mL/kg) 0 (0)    Total Intake(mL/kg) 0 (0)    Urine (mL/kg/hr) 0    Blood 80    Total Output 80    Net -80         Urine Occurrence 2 x      Physical Exam: Alert and oriented X3 Lungs: Clear and unlabored Heart: regular rate and rhythm / no mumurs Abdomen: soft, non-tender, non-distended  Fundus: firm, non-tender Perineum: intact Lochia: appropriate Extremities: no edema, no calf pain or tenderness    A:  PPD # 1  Normal exam Desires permanent sterilization     -NPO since 2100  P:  Routine post partum orders Plan to tx to OR Anticipate D/C on 06/11/20  Plan reviewed w/ Dr. 06/13/20, MSN, CNM 06/10/2020, 10:02 AM

## 2020-06-10 NOTE — Anesthesia Preprocedure Evaluation (Signed)
Anesthesia Evaluation  Patient identified by MRN, date of birth, ID band Patient awake    Reviewed: Allergy & Precautions, NPO status , Patient's Chart, lab work & pertinent test results  Airway Mallampati: II  TM Distance: >3 FB Neck ROM: Full    Dental no notable dental hx.    Pulmonary neg pulmonary ROS, former smoker,    Pulmonary exam normal breath sounds clear to auscultation       Cardiovascular negative cardio ROS Normal cardiovascular exam Rhythm:Regular Rate:Normal     Neuro/Psych PSYCHIATRIC DISORDERS Depression negative neurological ROS     GI/Hepatic negative GI ROS, Neg liver ROS,   Endo/Other  negative endocrine ROS  Renal/GU negative Renal ROS  negative genitourinary   Musculoskeletal negative musculoskeletal ROS (+)   Abdominal   Peds  Hematology negative hematology ROS (+)   Anesthesia Other Findings PPTL, no epidural for labor, requests GA  Reproductive/Obstetrics                             Anesthesia Physical Anesthesia Plan  ASA: II  Anesthesia Plan: General   Post-op Pain Management:    Induction: Intravenous and Rapid sequence  PONV Risk Score and Plan: 3 and Midazolam, Dexamethasone and Ondansetron  Airway Management Planned: Oral ETT  Additional Equipment:   Intra-op Plan:   Post-operative Plan: Extubation in OR  Informed Consent: I have reviewed the patients History and Physical, chart, labs and discussed the procedure including the risks, benefits and alternatives for the proposed anesthesia with the patient or authorized representative who has indicated his/her understanding and acceptance.     Dental advisory given  Plan Discussed with: CRNA  Anesthesia Plan Comments:         Anesthesia Quick Evaluation

## 2020-06-10 NOTE — Anesthesia Procedure Notes (Signed)
Procedure Name: Intubation Date/Time: 06/10/2020 12:12 PM Performed by: Cleda Clarks, CRNA Pre-anesthesia Checklist: Patient identified, Emergency Drugs available, Suction available and Patient being monitored Patient Re-evaluated:Patient Re-evaluated prior to induction Oxygen Delivery Method: Circle system utilized Preoxygenation: Pre-oxygenation with 100% oxygen Induction Type: IV induction Ventilation: Mask ventilation without difficulty Laryngoscope Size: Glidescope and 3 Tube type: Oral Number of attempts: 1 Airway Equipment and Method: Stylet and Oral airway Placement Confirmation: ETT inserted through vocal cords under direct vision,  positive ETCO2 and breath sounds checked- equal and bilateral Secured at: 21 cm Tube secured with: Tape Dental Injury: Teeth and Oropharynx as per pre-operative assessment

## 2020-06-11 LAB — SURGICAL PATHOLOGY

## 2020-06-11 MED ORDER — OXYCODONE HCL 5 MG PO TABS: 5.0000 mg | ORAL_TABLET | ORAL | 0 refills | Status: DC | PRN

## 2020-06-11 NOTE — Discharge Summary (Signed)
Postpartum Discharge Summary      Patient Name: Danielle Cherry DOB: 05/06/1986 MRN: [id number]  Date of admission: 06/09/2020 Delivery date:06/09/2020  Delivering provider: Waymon Amato  Date of discharge: 06/11/2020  Admitting diagnosis: Indication for care in labor or delivery [O75.9] Intrauterine pregnancy: [redacted]w[redacted]d    Secondary diagnosis:  Active Problems:   Indication for care in labor or delivery   SVD (10/25)   S/P tubal ligation  Additional problems: NA    Discharge diagnosis: Term Pregnancy Delivered                                              Post partum procedures:NA Augmentation: N/A Complications: None  Hospital course: Onset of Labor With Vaginal Delivery      34y.o. yo [id number] 32w1das admitted in Active Labor on 06/09/2020. Patient had an uncomplicated labor course as follows:  Membrane Rupture Time/Date: 7:00 AM ,06/09/2020   Delivery Method:Vaginal, Spontaneous  Episiotomy: None  Lacerations:  None  Patient had an uncomplicated postpartum course.  She is ambulating, tolerating a regular diet, passing flatus, and urinating well. Patient is discharged home in stable condition on 06/11/20.  Newborn Data: Birth date:06/09/2020  Birth time:5:19 PM  Gender:Female  Living status:Living  Apgars:9 ,9  Weight:3130 g   Magnesium Sulfate received: No BMZ received: No Rhophylac:N/A MMR:No T-DaP:Given prenatally Flu: No Transfusion:No  Physical exam  Vitals:   06/10/20 1440 06/10/20 1932 06/10/20 2346 06/11/20 0403  BP: 122/72 112/70 103/71 119/76  Pulse:  85 67 60  Resp: 18 16 18 18   Temp: 98.4 F (36.9 C) 98.6 F (37 C) 98.2 F (36.8 C) 97.9 F (36.6 C)  TempSrc: Oral Oral Oral Oral  SpO2:  95% 97% 98%  Weight:      Height:       General: alert, cooperative and no distress Lochia: appropriate Uterine Fundus: firm Incision: N/A DVT Evaluation: No evidence of DVT seen on physical exam. Negative Homan's sign. No cords or calf  tenderness. No significant calf/ankle edema. Labs: Lab Results  Component Value Date   WBC 13.4 (H) 06/10/2020   HGB 11.8 (L) 06/10/2020   HCT 36.5 06/10/2020   MCV 91.0 06/10/2020   PLT 156 06/10/2020   CMP Latest Ref Rng & Units 05/23/2017  Glucose 65 - 99 mg/dL 108(H)  BUN 6 - 20 mg/dL 5(L)  Creatinine 0.44 - 1.00 mg/dL 0.62  Sodium 135 - 145 mmol/L 137  Potassium 3.5 - 5.1 mmol/L 3.6  Chloride 101 - 111 mmol/L 106  CO2 22 - 32 mmol/L 24  Calcium 8.9 - 10.3 mg/dL 9.0  Total Protein 6.0 - 8.3 g/dL -  Total Bilirubin 0.3 - 1.2 mg/dL -  Alkaline Phos 39 - 117 U/L -  AST 0 - 37 U/L -  ALT 0 - 35 U/L -   Edinburgh Score: Edinburgh Postnatal Depression Scale Screening Tool 06/10/2020  I have been able to laugh and see the funny side of things. 0  I have looked forward with enjoyment to things. 0  I have blamed myself unnecessarily when things went wrong. 0  I have been anxious or worried for no good reason. 0  I have felt scared or panicky for no good reason. 0  Things have been getting on top of me. 0  I have been so unhappy that I have  had difficulty sleeping. 0  I have felt sad or miserable. 0  I have been so unhappy that I have been crying. 0  The thought of harming myself has occurred to me. 0  Edinburgh Postnatal Depression Scale Total 0      After visit meds:  Allergies as of 06/11/2020      Reactions   Strawberry Extract Anaphylaxis   Latex Itching      Medication List    STOP taking these medications   acetaminophen 325 MG tablet Commonly known as: TYLENOL   cetirizine 10 MG tablet Commonly known as: ZyrTEC Allergy   pseudoephedrine 30 MG tablet Commonly known as: SUDAFED     TAKE these medications   oxyCODONE 5 MG immediate release tablet Commonly known as: Oxy IR/ROXICODONE Take 1 tablet (5 mg total) by mouth every 4 (four) hours as needed (pain scale 4-7).        Discharge home in stable condition Infant Feeding: Bottle Infant  Disposition:home with mother Discharge instruction: per After Visit Summary and Postpartum booklet. Activity: Advance as tolerated. Pelvic rest for 6 weeks.  Diet: routine diet Anticipated Birth Control: BTL done PP Postpartum Appointment:6 weeks Additional Postpartum F/U: NA Future Appointments:No future appointments. Follow up Visit:  Follow-up Information    [facility], [place] Follow up in 6 week(s).   Specialty: Obstetrics and Gynecology Contact information: [address] [place] [address] [phone]                   06/11/2020 Ike Bene, CNM

## 2021-03-09 ENCOUNTER — Emergency Department (HOSPITAL_COMMUNITY)
Admission: EM | Admit: 2021-03-09 | Discharge: 2021-03-09 | Disposition: A | Discharge status: Home / Self Care | Payer: Medicaid Other | Attending: Emergency Medicine | Admitting: Emergency Medicine

## 2021-03-09 ENCOUNTER — Other Ambulatory Visit: Payer: Self-pay

## 2021-03-09 DIAGNOSIS — Z87891 Personal history of nicotine dependence: Secondary | ICD-10-CM | POA: Insufficient documentation

## 2021-03-09 DIAGNOSIS — H60503 Unspecified acute noninfective otitis externa, bilateral: Secondary | ICD-10-CM

## 2021-03-09 DIAGNOSIS — H9203 Otalgia, bilateral: Secondary | ICD-10-CM | POA: Diagnosis present

## 2021-03-09 MED ORDER — NEOMYCIN-POLYMYXIN-HC 3.5-10000-1 OT SUSP: 4.0000 [drp] | Freq: Three times a day (TID) | OTIC | 0 refills | Status: AC

## 2021-03-09 NOTE — ED Triage Notes (Signed)
Patient complains of bilateral ear pain with bleeding from left ear x 3 weeks, reports that the pain radiating up face and teeth. No congestion, no cough

## 2021-03-09 NOTE — ED Provider Notes (Signed)
[facility] EMERGENCY DEPARTMENT Provider Note   CSN: [id number] Arrival date & time: 03/09/21  9767     History No chief complaint on file.   Danielle Cherry is a 35 y.o. female presenting for evaluation of bilateral ear irritation.  Patient states this is been going on for several weeks, initially was in her left ear, now on her right.  She reports intermittent bleeding from this.  She has had increasing pain of her right ear today.  No fevers or chills.  No nasal congestion or sore throat.  No other medical problems. Sxs [place]  HPI     Past Medical History:  Diagnosis Date   Depression    took meds briefly, going to therapy, doing good   No pertinent past medical history     Patient Active Problem List   Diagnosis Date Noted   SVD (10/25) 06/10/2020   S/P tubal ligation 06/10/2020   Indication for care in labor or delivery 06/09/2020   Hospice care patient 07/06/2019   Depression 12/12/2017    Past Surgical History:  Procedure Laterality Date   DILATION AND EVACUATION N/A 07/06/2019   Procedure: DILATATION AND EVACUATION;  Surgeon: Jaymes Graff, MD;  Location: [facility];  Service: Gynecology;  Laterality: N/A;  Ultrasound Guidance   NO PAST SURGERIES     OPERATIVE ULTRASOUND N/A 07/06/2019   Procedure: OPERATIVE ULTRASOUND;  Surgeon: Jaymes Graff, MD;  Location: [facility];  Service: Gynecology;  Laterality: N/A;   TUBAL LIGATION N/A 06/10/2020   Procedure: POST PARTUM TUBAL LIGATION;  Surgeon: Essie Hart, MD;  Location: [facility];  Service: Gynecology;  Laterality: N/A;     OB History     Gravida  6   Para  4   Term  4   Preterm      AB  2   Living  4      SAB  2   IAB      Ectopic      Multiple  0   Live Births  4        Obstetric Comments  Bled after delivery of daughter, "had to get a shot"         Family History  Problem Relation Age of Onset    Diabetes Mother    Hypertension Mother    Hyperlipidemia Father    Cancer Paternal Grandfather        lung   Parkinson's disease Paternal Grandmother     Social History   Tobacco Use   Smoking status: Former    Types: Cigarettes   Smokeless tobacco: Never   Tobacco comments:    quit 2009  Vaping Use   Vaping Use: Never used  Substance Use Topics   Alcohol use: Not Currently    Alcohol/week: 1.0 standard drink    Types: 1 Glasses of wine per week    Comment: social, not with preg   Drug use: No    Home Medications Prior to Admission medications   Medication Sig Start Date End Date Taking? Authorizing Provider  neomycin-polymyxin-hydrocortisone (CORTISPORIN) 3.5-10000-1 OTIC suspension Place 4 drops into both ears 3 (three) times daily for 7 days. 03/09/21 03/16/21 Yes Zayvian Mcmurtry, PA-C  oxyCODONE (OXY IR/ROXICODONE) 5 MG immediate release tablet Take 1 tablet (5 mg total) by mouth every 4 (four) hours as needed (pain scale 4-7). 06/11/20   Crumpler, Lilyan Gilford, CNM    Allergies  Strawberry extract and Latex  Review of Systems   Review of Systems  Constitutional:  Negative for fever.  HENT:  Positive for ear pain.    Physical Exam Updated Vital Signs BP (!) 127/91 (BP Location: Right Arm)   Pulse 73   Temp 98.2 F (36.8 C) (Oral)   Resp 20   SpO2 98%   Physical Exam Vitals and nursing note reviewed.  Constitutional:      General: She is not in acute distress.    Appearance: She is well-developed.  HENT:     Head: Normocephalic and atraumatic.     Right Ear: Tympanic membrane and external ear normal.     Left Ear: Tympanic membrane and external ear normal.     Ears:     Comments: Bilateral ear canal irritation, erythema, edema.  No active bleeding.  TMs normal bilaterally Eyes:     Extraocular Movements: Extraocular movements intact.  Cardiovascular:     Rate and Rhythm: Normal rate.  Pulmonary:     Effort: Pulmonary effort is normal.  Abdominal:      General: There is no distension.  Musculoskeletal:        General: Normal range of motion.     Cervical back: Normal range of motion.  Skin:    General: Skin is warm.     Findings: No rash.  Neurological:     Mental Status: She is alert and oriented to person, place, and time.    ED Results / Procedures / Treatments   Labs (all labs ordered are listed, but only abnormal results are displayed) Labs Reviewed - No data to display  EKG None  Radiology No results found.  Procedures Procedures   Medications Ordered in ED Medications - No data to display  ED Course  I have reviewed the triage vital signs and the nursing notes.  Pertinent labs & imaging results that were available during my care of the patient were reviewed by me and considered in my medical decision making (see chart for details).    MDM Rules/Calculators/A&P                           Patient presenting for evaluation of bilateral ear irritation.  On exam, patient appears nontoxic.  Consistent with otitis externa bilaterally.  Discussed use of eardrops.  Follow-up with ENT as needed.  At this time, patient received a discharge.  Return precautions given.  Patient states she understands and agrees to plan.   Final Clinical Impression(s) / ED Diagnoses Final diagnoses:  Acute otitis externa of both ears, unspecified type    Rx / DC Orders ED Discharge Orders          Ordered    neomycin-polymyxin-hydrocortisone (CORTISPORIN) 3.5-10000-1 OTIC suspension  3 times daily        03/09/21 1012             Wheeler, [place], PA-C 03/09/21 1136    Mancel Bale, MD 03/10/21 1801

## 2021-03-09 NOTE — Discharge Instructions (Signed)
Use the eardrops as prescribed.  Use for a week. Take Tylenol or ibuprofen as needed for pain. Stop using Q-tips until the ears have completely healed. Follow-up with ear nose and throat doctor listed below symptoms or not improving. Return to the emergency room if you develop high fevers, severe worsening pain, or any new, worsening, or concerning symptoms

## 2021-03-12 ENCOUNTER — Emergency Department (HOSPITAL_COMMUNITY)
Admission: EM | Admit: 2021-03-12 | Discharge: 2021-03-13 | Disposition: A | Discharge status: Home / Self Care | Payer: Medicaid Other | Attending: Emergency Medicine | Admitting: Emergency Medicine

## 2021-03-12 DIAGNOSIS — H9201 Otalgia, right ear: Secondary | ICD-10-CM | POA: Diagnosis present

## 2021-03-12 DIAGNOSIS — H60311 Diffuse otitis externa, right ear: Secondary | ICD-10-CM

## 2021-03-12 DIAGNOSIS — H6091 Unspecified otitis externa, right ear: Secondary | ICD-10-CM | POA: Insufficient documentation

## 2021-03-12 DIAGNOSIS — Z87891 Personal history of nicotine dependence: Secondary | ICD-10-CM | POA: Insufficient documentation

## 2021-03-12 NOTE — ED Triage Notes (Signed)
Pt reported to ED with c/o recent diagnosis on Monday of swimmer's ear. Reports increased pain to area and states ear drops given are not effective.

## 2021-03-13 MED ORDER — IBUPROFEN 600 MG PO TABS: 600.0000 mg | ORAL_TABLET | Freq: Four times a day (QID) | ORAL | 0 refills | Status: DC | PRN

## 2021-03-13 MED ORDER — AMOXICILLIN-POT CLAVULANATE 875-125 MG PO TABS: 1.0000 | ORAL_TABLET | Freq: Two times a day (BID) | ORAL | 0 refills | Status: DC

## 2021-03-13 MED ORDER — HYDROCODONE-ACETAMINOPHEN 5-325 MG PO TABS
1.0000 | ORAL_TABLET | Freq: Once | ORAL | Status: AC
  Administered 2021-03-13: 1 via ORAL
  Filled 2021-03-13: qty 1

## 2021-03-13 MED ORDER — AMOXICILLIN-POT CLAVULANATE 875-125 MG PO TABS
1.0000 | ORAL_TABLET | Freq: Once | ORAL | Status: AC
  Administered 2021-03-13: 1 via ORAL
  Filled 2021-03-13: qty 1

## 2021-03-13 NOTE — Discharge Instructions (Addendum)
Continue the ear drops as previously instructed for an additional 5 days. Take Augmentin oral antibiotic and ibuprofen 600 mg as prescribed.

## 2021-03-13 NOTE — ED Provider Notes (Signed)
[facility] EMERGENCY DEPARTMENT Provider Note   CSN: [id number] Arrival date & time: 03/12/21  2254     History Chief Complaint  Patient presents with   Otalgia    Danielle Cherry is a 35 y.o. female.  Patient to ED with complaint of right ear pain and drainage. She was diagnosed with bilateral otitis externa 3 days ago and started Cipro Otic drops. She reports her left ear is much better but the pain in the right ear has worsened. No fever. No other new symptoms.   The history is provided by the patient. No language interpreter was used.  Otalgia Associated symptoms: no congestion, no cough, no fever and no sore throat       Past Medical History:  Diagnosis Date   Depression    took meds briefly, going to therapy, doing good   No pertinent past medical history     Patient Active Problem List   Diagnosis Date Noted   SVD (10/25) 06/10/2020   S/P tubal ligation 06/10/2020   Indication for care in labor or delivery 06/09/2020   Hospice care patient 07/06/2019   Depression 12/12/2017    Past Surgical History:  Procedure Laterality Date   DILATION AND EVACUATION N/A 07/06/2019   Procedure: DILATATION AND EVACUATION;  Surgeon: Jaymes Graff, MD;  Location: [facility];  Service: Gynecology;  Laterality: N/A;  Ultrasound Guidance   NO PAST SURGERIES     OPERATIVE ULTRASOUND N/A 07/06/2019   Procedure: OPERATIVE ULTRASOUND;  Surgeon: Jaymes Graff, MD;  Location: [facility];  Service: Gynecology;  Laterality: N/A;   TUBAL LIGATION N/A 06/10/2020   Procedure: POST PARTUM TUBAL LIGATION;  Surgeon: Essie Hart, MD;  Location: [facility];  Service: Gynecology;  Laterality: N/A;     OB History     Gravida  6   Para  4   Term  4   Preterm      AB  2   Living  4      SAB  2   IAB      Ectopic      Multiple  0   Live Births  4        Obstetric Comments  Bled after delivery of daughter, "had to get a shot"          Family History  Problem Relation Age of Onset   Diabetes Mother    Hypertension Mother    Hyperlipidemia Father    Cancer Paternal Grandfather        lung   Parkinson's disease Paternal Grandmother     Social History   Tobacco Use   Smoking status: Former    Types: Cigarettes   Smokeless tobacco: Never   Tobacco comments:    quit 2009  Vaping Use   Vaping Use: Never used  Substance Use Topics   Alcohol use: Not Currently    Alcohol/week: 1.0 standard drink    Types: 1 Glasses of wine per week    Comment: social, not with preg   Drug use: No    Home Medications Prior to Admission medications   Medication Sig Start Date End Date Taking? Authorizing Provider  neomycin-polymyxin-hydrocortisone (CORTISPORIN) 3.5-10000-1 OTIC suspension Place 4 drops into both ears 3 (three) times daily for 7 days. 03/09/21 03/16/21  Caccavale, Sophia, PA-C  oxyCODONE (OXY IR/ROXICODONE) 5 MG immediate release tablet Take 1 tablet (5 mg total) by mouth every 4 (four) hours as needed (pain scale 4-7).  06/11/20   Crumpler, Lilyan Gilford, CNM    Allergies    Strawberry extract and Latex  Review of Systems   Review of Systems  Constitutional:  Negative for fever.  HENT:  Positive for ear pain. Negative for congestion and sore throat.   Respiratory:  Negative for cough.    Physical Exam Updated Vital Signs BP 134/87   Pulse 69   Temp 98.4 F (36.9 C) (Oral)   Resp 18   Ht 4\' 11"  (1.499 m)   Wt 76.2 kg   SpO2 98%   BMI 33.93 kg/m   Physical Exam Vitals and nursing note reviewed.  Constitutional:      Appearance: Normal appearance.  HENT:     Head: Normocephalic.     Left Ear: Tympanic membrane and ear canal normal.     Ears:     Comments: Right TM obscurred. There is swelling of the external canal with moist drainage. No bleeding. Pain with movement of external ear. No pre- or postauricular nodes, but there is swelling and tenderness over preauricular ear. No redness. No  evidence to suggest mastoiditis.   Neurological:     Mental Status: She is alert.    ED Results / Procedures / Treatments   Labs (all labs ordered are listed, but only abnormal results are displayed) Labs Reviewed - No data to display  EKG None  Radiology No results found.  Procedures Procedures   Medications Ordered in ED Medications - No data to display  ED Course  I have reviewed the triage vital signs and the nursing notes.  Pertinent labs & imaging results that were available during my care of the patient were reviewed by me and considered in my medical decision making (see chart for details).    MDM Rules/Calculators/A&P                           Patient here with worsening ear pain on right after diagnosis of externa 3 days ago. No fever. Left ear improved since that time.   Likely worsening infection secondary to swelling that is obstructing the canal. Ear Wick inserted. Because the symptoms/pain are worse, ear drum obscurred so not visualized, and swelling and pain to surrounding ear, will start oral abx to cover developing otitis media.   Will refer to ENT for recheck if symptoms are not improving in 2-3 days.   Final Clinical Impression(s) / ED Diagnoses Final diagnoses:  None   Right otitis externa Right ear pain  Rx / DC Orders ED Discharge Orders     None        03/14/21 2318    Palumbo, April, MD 03/17/21 2252

## 2021-04-22 DIAGNOSIS — S0991XA Unspecified injury of ear, initial encounter: Secondary | ICD-10-CM | POA: Insufficient documentation

## 2021-05-09 ENCOUNTER — Encounter (HOSPITAL_COMMUNITY): Payer: Self-pay | Admitting: Emergency Medicine

## 2021-05-09 ENCOUNTER — Other Ambulatory Visit: Payer: Self-pay

## 2021-05-09 ENCOUNTER — Emergency Department (HOSPITAL_COMMUNITY)
Admission: EM | Admit: 2021-05-09 | Discharge: 2021-05-10 | Disposition: A | Discharge status: Home / Self Care | Payer: Medicaid Other | Attending: Emergency Medicine | Admitting: Emergency Medicine

## 2021-05-09 DIAGNOSIS — Z87891 Personal history of nicotine dependence: Secondary | ICD-10-CM | POA: Insufficient documentation

## 2021-05-09 DIAGNOSIS — J020 Streptococcal pharyngitis: Secondary | ICD-10-CM | POA: Diagnosis not present

## 2021-05-09 DIAGNOSIS — Z91040 Latex allergy status: Secondary | ICD-10-CM | POA: Diagnosis not present

## 2021-05-09 DIAGNOSIS — J029 Acute pharyngitis, unspecified: Secondary | ICD-10-CM | POA: Diagnosis present

## 2021-05-09 DIAGNOSIS — Z20822 Contact with and (suspected) exposure to covid-19: Secondary | ICD-10-CM | POA: Insufficient documentation

## 2021-05-09 LAB — RESP PANEL BY RT-PCR (FLU A&B, COVID) ARPGX2
Influenza A by PCR: NEGATIVE
Influenza B by PCR: NEGATIVE
SARS Coronavirus 2 by RT PCR: NEGATIVE

## 2021-05-09 LAB — GROUP A STREP BY PCR: Group A Strep by PCR: DETECTED — AB

## 2021-05-09 NOTE — ED Triage Notes (Signed)
Pt reports sore throat that started Wednesday.  On Thursday she started having body aches and fever.  Took a negative home COVID test.  Now having neck stiffness and feels like posterior neck is swollen.

## 2021-05-10 MED ORDER — PENICILLIN G BENZATHINE 1200000 UNIT/2ML IM SUSY
1.2000 10*6.[IU] | PREFILLED_SYRINGE | Freq: Once | INTRAMUSCULAR | Status: AC
  Administered 2021-05-10: 1.2 10*6.[IU] via INTRAMUSCULAR
  Filled 2021-05-10: qty 2

## 2021-05-10 MED ORDER — DEXAMETHASONE 10 MG/ML FOR PEDIATRIC ORAL USE
10.0000 mg | Freq: Once | INTRAMUSCULAR | Status: AC
  Administered 2021-05-10: 10 mg via ORAL
  Filled 2021-05-10: qty 1

## 2021-05-10 NOTE — ED Provider Notes (Signed)
[facility] EMERGENCY DEPARTMENT Provider Note   CSN: [id number] Arrival date & time: 05/09/21  1752     History No chief complaint on file.   Danielle Cherry is a 35 y.o. female.  The history is provided by the patient.  Danielle Cherry is a 35 y.o. female who presents to the Emergency Department complaining of sore throat. She presents the emergency department complaining of sore throat that started on Wednesday. She has pain with swallowing but is able to handle her secretions. She had fevers the date her symptoms began. She feels pain and swelling to the back of her neck. No nausea, vomiting. No prior similar symptoms. She has no known medical problems.    Past Medical History:  Diagnosis Date   Depression    took meds briefly, going to therapy, doing good   No pertinent past medical history     Patient Active Problem List   Diagnosis Date Noted   SVD (10/25) 06/10/2020   S/P tubal ligation 06/10/2020   Indication for care in labor or delivery 06/09/2020   Hospice care patient 07/06/2019   Depression 12/12/2017    Past Surgical History:  Procedure Laterality Date   DILATION AND EVACUATION N/A 07/06/2019   Procedure: DILATATION AND EVACUATION;  Surgeon: Jaymes Graff, MD;  Location: [facility];  Service: Gynecology;  Laterality: N/A;  Ultrasound Guidance   NO PAST SURGERIES     OPERATIVE ULTRASOUND N/A 07/06/2019   Procedure: OPERATIVE ULTRASOUND;  Surgeon: Jaymes Graff, MD;  Location: [facility];  Service: Gynecology;  Laterality: N/A;   TUBAL LIGATION N/A 06/10/2020   Procedure: POST PARTUM TUBAL LIGATION;  Surgeon: Essie Hart, MD;  Location: [facility];  Service: Gynecology;  Laterality: N/A;     OB History     Gravida  6   Para  4   Term  4   Preterm      AB  2   Living  4      SAB  2   IAB      Ectopic      Multiple  0   Live Births  4        Obstetric Comments  Bled after delivery of  daughter, "had to get a shot"         Family History  Problem Relation Age of Onset   Diabetes Mother    Hypertension Mother    Hyperlipidemia Father    Cancer Paternal Grandfather        lung   Parkinson's disease Paternal Grandmother     Social History   Tobacco Use   Smoking status: Former    Types: Cigarettes   Smokeless tobacco: Never   Tobacco comments:    quit 2009  Vaping Use   Vaping Use: Never used  Substance Use Topics   Alcohol use: Not Currently    Alcohol/week: 1.0 standard drink    Types: 1 Glasses of wine per week    Comment: social, not with preg   Drug use: No    Home Medications Prior to Admission medications   Medication Sig Start Date End Date Taking? Authorizing Provider  amoxicillin-clavulanate (AUGMENTIN) 875-125 MG tablet Take 1 tablet by mouth every 12 (twelve) hours. 03/13/21   Elpidio Anis, PA-C  ibuprofen (ADVIL) 600 MG tablet Take 1 tablet (600 mg total) by mouth every 6 (six) hours as needed. 03/13/21   Elpidio Anis, PA-C  oxyCODONE (OXY IR/ROXICODONE) 5 MG immediate  release tablet Take 1 tablet (5 mg total) by mouth every 4 (four) hours as needed (pain scale 4-7). 06/11/20   Crumpler, Lilyan Gilford, CNM    Allergies    Strawberry extract and Latex  Review of Systems   Review of Systems  All other systems reviewed and are negative.  Physical Exam Updated Vital Signs BP (!) 123/91 (BP Location: Left Arm)   Pulse 83   Temp 98.3 F (36.8 C)   Resp 18   SpO2 99%   Physical Exam Vitals and nursing note reviewed.  Constitutional:      Appearance: She is well-developed.  HENT:     Head: Normocephalic and atraumatic.     Comments: Moderate tonsilar erythema and edema bilaterally with tonsil or exudate bilaterally Neck:     Comments: Mild cervical lymphadenopathy Cardiovascular:     Rate and Rhythm: Normal rate and regular rhythm.     Heart sounds: No murmur heard. Pulmonary:     Effort: Pulmonary effort is normal. No  respiratory distress.     Breath sounds: Normal breath sounds. No stridor.  Musculoskeletal:        General: No tenderness.     Cervical back: Neck supple.  Skin:    General: Skin is warm and dry.  Neurological:     Mental Status: She is alert and oriented to person, place, and time.  Psychiatric:        Behavior: Behavior normal.    ED Results / Procedures / Treatments   Labs (all labs ordered are listed, but only abnormal results are displayed) Labs Reviewed  GROUP A STREP BY PCR - Abnormal; Notable for the following components:      Result Value   Group A Strep by PCR DETECTED (*)    All other components within normal limits  RESP PANEL BY RT-PCR (FLU A&B, COVID) [id number]    EKG None  Radiology No results found.  Procedures Procedures   Medications Ordered in ED Medications  dexamethasone (DECADRON) 10 MG/ML injection for Pediatric ORAL use 10 mg (has no administration in time range)  penicillin g benzathine (BICILLIN LA) 1200000 UNIT/2ML injection 1.2 Million Units (has no administration in time range)    ED Course  I have reviewed the triage vital signs and the nursing notes.  Pertinent labs & imaging results that were available during my care of the patient were reviewed by me and considered in my medical decision making (see chart for details).    MDM Rules/Calculators/A&P                          patient here for evaluation of sore throat. She does feel like the back of her neck is swollen. On examination she is non-toxic appearing and well hydrated.  She is positive for strep today. Current presentation is not consistent with RPA, epiglotitis, PTA. Discussed with patient home care for strep pharyngitis with outpatient follow-up and return precautions  Final Clinical Impression(s) / ED Diagnoses Final diagnoses:  Strep pharyngitis    Rx / DC Orders ED Discharge Orders     None        Tilden Fossa, MD 05/10/21 0007

## 2022-05-16 ENCOUNTER — Ambulatory Visit (HOSPITAL_COMMUNITY)
Admission: EM | Admit: 2022-05-16 | Discharge: 2022-05-16 | Disposition: A | Discharge status: Home / Self Care | Payer: Medicaid Other | Attending: Internal Medicine | Admitting: Internal Medicine

## 2022-05-16 ENCOUNTER — Encounter (HOSPITAL_COMMUNITY): Payer: Self-pay

## 2022-05-16 DIAGNOSIS — H60393 Other infective otitis externa, bilateral: Secondary | ICD-10-CM

## 2022-05-16 MED ORDER — OFLOXACIN 0.3 % OT SOLN: 10.0000 [drp] | Freq: Every day | OTIC | 0 refills | Status: DC

## 2022-05-16 NOTE — ED Provider Notes (Signed)
[place]    CSN: [id number] Arrival date & time: 05/16/22  1101      History   Chief Complaint Chief Complaint  Patient presents with   Ear Pain    HPI Danielle Cherry is a 36 y.o. female.   Patient presents urgent care for evaluation of left-sided ear pain that started yesterday.  She gets frequent otitis externa infections as of 2 years ago and states that she swims frequently.  She has been seen by specialist for this in the past who recommended that she begin to wear earplugs while she showers to prevent frequent otitis externa infections.  She has been following instructions by specialist and has not been swimming lately but believes that she has another infection to the left ear.  Pain is currently significant to the left ear.  She denies drainage from the ears, headache, eye drainage, sore throat, cough, fever/chills, neck pain, dizziness, and tinnitus.  Hearing to the left ear is decreased and muffled.  She has not attempted use of any over-the-counter medications prior to arrival urgent care for symptoms.     Past Medical History:  Diagnosis Date   Depression    took meds briefly, going to therapy, doing good   No pertinent past medical history     Patient Active Problem List   Diagnosis Date Noted   SVD (10/25) 06/10/2020   S/P tubal ligation 06/10/2020   Indication for care in labor or delivery 06/09/2020   Hospice care patient 07/06/2019   Depression 12/12/2017    Past Surgical History:  Procedure Laterality Date   DILATION AND EVACUATION N/A 07/06/2019   Procedure: DILATATION AND EVACUATION;  Surgeon: Crawford Givens, MD;  Location: [place];  Service: Gynecology;  Laterality: N/A;  Ultrasound Guidance   NO PAST SURGERIES     OPERATIVE ULTRASOUND N/A 07/06/2019   Procedure: OPERATIVE ULTRASOUND;  Surgeon: Crawford Givens, MD;  Location: [place];  Service: Gynecology;  Laterality: N/A;   TUBAL LIGATION N/A  06/10/2020   Procedure: POST PARTUM TUBAL LIGATION;  Surgeon: Sanjuana Kava, MD;  Location: [facility];  Service: Gynecology;  Laterality: N/A;    OB History     Gravida  6   Para  4   Term  4   Preterm      AB  2   Living  4      SAB  2   IAB      Ectopic      Multiple  0   Live Births  4        Obstetric Comments  Bled after delivery of daughter, "had to get a shot"          Home Medications    Prior to Admission medications   Medication Sig Start Date End Date Taking? Authorizing Provider  ofloxacin (FLOXIN) 0.3 % OTIC solution Place 10 drops into the left ear daily. 05/16/22  Yes Talbot Grumbling, FNP  amoxicillin-clavulanate (AUGMENTIN) 875-125 MG tablet Take 1 tablet by mouth every 12 (twelve) hours. 03/13/21   Charlann Lange, PA-C  ibuprofen (ADVIL) 600 MG tablet Take 1 tablet (600 mg total) by mouth every 6 (six) hours as needed. 03/13/21   Charlann Lange, PA-C  oxyCODONE (OXY IR/ROXICODONE) 5 MG immediate release tablet Take 1 tablet (5 mg total) by mouth every 4 (four) hours as needed (pain scale 4-7). 06/11/20   Crumpler, Marveen Reeks, CNM    Family History Family History  Problem  Relation Age of Onset   Diabetes Mother    Hypertension Mother    Hyperlipidemia Father    Cancer Paternal Grandfather        lung   Parkinson's disease Paternal Grandmother     Social History Social History   Tobacco Use   Smoking status: Former    Types: Cigarettes   Smokeless tobacco: Never   Tobacco comments:    quit 2009  Vaping Use   Vaping Use: Never used  Substance Use Topics   Alcohol use: Not Currently    Alcohol/week: 1.0 standard drink of alcohol    Types: 1 Glasses of wine per week    Comment: social, not with preg   Drug use: No     Allergies   Strawberry extract and Latex   Review of Systems Review of Systems Per HPI  Physical Exam Triage Vital Signs ED Triage Vitals [05/16/22 1127]  Enc Vitals Group     BP 112/82     Pulse  Rate 72     Resp 18     Temp 98.2 F (36.8 C)     Temp Source Oral     SpO2 98 %     Weight      Height      Head Circumference      Peak Flow      Pain Score      Pain Loc      Pain Edu?      Excl. in GC?    No data found.  Updated Vital Signs BP 112/82 (BP Location: Left Arm)   Pulse 72   Temp 98.2 F (36.8 C) (Oral)   Resp 18   SpO2 98%   Visual Acuity Right Eye Distance:   Left Eye Distance:   Bilateral Distance:    Right Eye Near:   Left Eye Near:    Bilateral Near:     Physical Exam Vitals and nursing note reviewed.  Constitutional:      Appearance: She is not ill-appearing or toxic-appearing.  HENT:     Head: Normocephalic and atraumatic.     Right Ear: Hearing, tympanic membrane and external ear normal. No decreased hearing noted. Drainage present. No swelling or tenderness. No middle ear effusion.     Left Ear: External ear normal. Decreased hearing noted. Drainage, swelling and tenderness present.     Ears:     Comments: Decreased hearing to the left ear due to drainage and swelling of the left ear canal.  Unable to fully visualize the left tympanic membrane due to swelling and erythema of the left ear canal.  Significant amount of purulent and thick drainage to the left ear canal.  There is a small amount of purulent drainage to the right ear canal with normal-appearing tympanic membrane.  Tenderness to palpation to the preauricular area of the left ear.    Nose: Nose normal.     Mouth/Throat:     Lips: Pink.  Eyes:     General: Lids are normal. Vision grossly intact. Gaze aligned appropriately.     Extraocular Movements: Extraocular movements intact.     Conjunctiva/sclera: Conjunctivae normal.  Pulmonary:     Effort: Pulmonary effort is normal.  Musculoskeletal:     Cervical back: Normal range of motion and neck supple.  Lymphadenopathy:     Cervical: No cervical adenopathy.  Skin:    General: Skin is warm and dry.     Capillary Refill:  Capillary refill takes less  than 2 seconds.     Findings: No rash.  Neurological:     General: No focal deficit present.     Mental Status: She is alert and oriented to person, place, and time. Mental status is at baseline.     Cranial Nerves: No dysarthria or facial asymmetry.  Psychiatric:        Mood and Affect: Mood normal.        Speech: Speech normal.        Behavior: Behavior normal.        Thought Content: Thought content normal.        Judgment: Judgment normal.      UC Treatments / Results  Labs (all labs ordered are listed, but only abnormal results are displayed) Labs Reviewed - No data to display  EKG   Radiology No results found.  Procedures Procedures (including critical care time)  Medications Ordered in UC Medications - No data to display  Initial Impression / Assessment and Plan / UC Course  I have reviewed the triage vital signs and the nursing notes.  Pertinent labs & imaging results that were available during my care of the patient were reviewed by me and considered in my medical decision making (see chart for details).   1.  Infective otitis externa of both ears Unable to visualize the left tympanic membrane fully and therefore we will treat otitis externa with ofloxacin eardrops once daily 10 drops to the left ear.  Patient may use drops to the right ear as well since she has a significant history of developing severe otitis externa infections and there is currently a small amount of purulent drainage to the right ear canal as well.  Patient advised to continue using earplugs to the ears during showers and discussed follow-up/return precautions with specialist, PCP or urgent care if symptoms fail to improve in the next 2 to 3 days with ofloxacin eardrops.  Discussed physical exam and available lab work findings in clinic with patient.  Counseled patient regarding appropriate use of medications and potential side effects for all medications recommended  or prescribed today. Discussed red flag signs and symptoms of worsening condition,when to call the PCP office, return to urgent care, and when to seek higher level of care in the emergency department. Patient verbalizes understanding and agreement with plan. All questions answered. Patient discharged in stable condition.    Final Clinical Impressions(s) / UC Diagnoses   Final diagnoses:  Infective otitis externa of both ears     Discharge Instructions      Use ofloxacin eardrops to both ears once daily for the next 7 days to treat infection to the left ear canal.   Continue using earplugs to the left ear while in the shower and avoid getting your canals wet.  If you develop any new or worsening symptoms or do not improve in the next 2 to 3 days, please return.  If your symptoms are severe, please go to the emergency room.  Follow-up with your primary care provider for further evaluation and management of your symptoms as well as ongoing wellness visits.  I hope you feel better!   ED Prescriptions     Medication Sig Dispense Auth. Provider   ofloxacin (FLOXIN) 0.3 % OTIC solution Place 10 drops into the left ear daily. 5 mL Carlisle Beers, FNP      PDMP not reviewed this encounter.   Carlisle Beers, [place] 05/16/22 1203

## 2022-05-16 NOTE — ED Triage Notes (Signed)
Onset 1-2 days of bilateral ear pain. Pt reports she is not taking any medication at this time.

## 2022-05-16 NOTE — Discharge Instructions (Addendum)
Use ofloxacin eardrops to both ears once daily for the next 7 days to treat infection to the left ear canal.   Continue using earplugs to the left ear while in the shower and avoid getting your canals wet.  If you develop any new or worsening symptoms or do not improve in the next 2 to 3 days, please return.  If your symptoms are severe, please go to the emergency room.  Follow-up with your primary care provider for further evaluation and management of your symptoms as well as ongoing wellness visits.  I hope you feel better!

## 2022-05-17 ENCOUNTER — Emergency Department (HOSPITAL_COMMUNITY): Payer: Medicaid Other

## 2022-05-17 ENCOUNTER — Emergency Department (HOSPITAL_COMMUNITY)
Admission: EM | Admit: 2022-05-17 | Discharge: 2022-05-17 | Disposition: A | Discharge status: Home / Self Care | Payer: Medicaid Other | Attending: Emergency Medicine | Admitting: Emergency Medicine

## 2022-05-17 ENCOUNTER — Other Ambulatory Visit: Payer: Self-pay

## 2022-05-17 ENCOUNTER — Encounter (HOSPITAL_COMMUNITY): Payer: Self-pay

## 2022-05-17 DIAGNOSIS — Z91040 Latex allergy status: Secondary | ICD-10-CM | POA: Insufficient documentation

## 2022-05-17 DIAGNOSIS — H60392 Other infective otitis externa, left ear: Secondary | ICD-10-CM | POA: Diagnosis not present

## 2022-05-17 DIAGNOSIS — H6690 Otitis media, unspecified, unspecified ear: Secondary | ICD-10-CM

## 2022-05-17 DIAGNOSIS — Z20822 Contact with and (suspected) exposure to covid-19: Secondary | ICD-10-CM | POA: Insufficient documentation

## 2022-05-17 DIAGNOSIS — H6692 Otitis media, unspecified, left ear: Secondary | ICD-10-CM | POA: Insufficient documentation

## 2022-05-17 DIAGNOSIS — H9202 Otalgia, left ear: Secondary | ICD-10-CM | POA: Diagnosis present

## 2022-05-17 LAB — URINALYSIS, ROUTINE W REFLEX MICROSCOPIC
Bilirubin Urine: NEGATIVE
Glucose, UA: NEGATIVE mg/dL
Ketones, ur: NEGATIVE mg/dL
Leukocytes,Ua: NEGATIVE
Nitrite: NEGATIVE
Protein, ur: NEGATIVE mg/dL
Specific Gravity, Urine: 1.023 (ref 1.005–1.030)
pH: 5 (ref 5.0–8.0)

## 2022-05-17 LAB — I-STAT BETA HCG BLOOD, ED (MC, WL, AP ONLY): I-stat hCG, quantitative: <5 m[IU]/mL (ref <5)

## 2022-05-17 LAB — CBC WITH DIFFERENTIAL/PLATELET
Abs Immature Granulocytes: 0.15 10*3/uL — ABNORMAL HIGH (ref 0.00–0.07)
Basophils Absolute: 0 10*3/uL (ref 0.0–0.1)
Basophils Relative: 0 %
Eosinophils Absolute: 0 10*3/uL (ref 0.0–0.5)
Eosinophils Relative: 0 %
HCT: 43.7 % (ref 36.0–46.0)
Hemoglobin: 14.6 g/dL (ref 12.0–15.0)
Immature Granulocytes: 1 %
Lymphocytes Relative: 12 %
Lymphs Abs: 1.4 10*3/uL (ref 0.7–4.0)
MCH: 31.1 pg (ref 26.0–34.0)
MCHC: 33.4 g/dL (ref 30.0–36.0)
MCV: 93.2 fL (ref 80.0–100.0)
Monocytes Absolute: 0.9 10*3/uL (ref 0.1–1.0)
Monocytes Relative: 8 %
Neutro Abs: 9.4 10*3/uL — ABNORMAL HIGH (ref 1.7–7.7)
Neutrophils Relative %: 79 %
Platelets: 223 10*3/uL (ref 150–400)
RBC: 4.69 MIL/uL (ref 3.87–5.11)
RDW: 12.4 % (ref 11.5–15.5)
WBC: 11.9 10*3/uL — ABNORMAL HIGH (ref 4.0–10.5)
nRBC: 0 % (ref 0.0–0.2)

## 2022-05-17 LAB — BASIC METABOLIC PANEL
Anion gap: 9 (ref 5–15)
BUN: 7 mg/dL (ref 6–20)
CO2: 22 mmol/L (ref 22–32)
Calcium: 8.9 mg/dL (ref 8.9–10.3)
Chloride: 107 mmol/L (ref 98–111)
Creatinine, Ser: 0.7 mg/dL (ref 0.44–1.00)
GFR, Estimated: >60 mL/min (ref >60)
Glucose, Bld: 121 mg/dL — ABNORMAL HIGH (ref 70–99)
Potassium: 3.9 mmol/L (ref 3.5–5.1)
Sodium: 138 mmol/L (ref 135–145)

## 2022-05-17 LAB — LACTIC ACID, PLASMA: Lactic Acid, Venous: 1.1 mmol/L (ref 0.5–1.9)

## 2022-05-17 LAB — SARS CORONAVIRUS 2 BY RT PCR: SARS Coronavirus 2 by RT PCR: NEGATIVE

## 2022-05-17 MED ORDER — AMOXICILLIN-POT CLAVULANATE 875-125 MG PO TABS
1.0000 | ORAL_TABLET | Freq: Once | ORAL | Status: AC
  Administered 2022-05-17: 1 via ORAL
  Filled 2022-05-17: qty 1

## 2022-05-17 MED ORDER — AMOXICILLIN-POT CLAVULANATE 875-125 MG PO TABS: 1.0000 | ORAL_TABLET | Freq: Two times a day (BID) | ORAL | 0 refills | Status: DC

## 2022-05-17 MED ORDER — ACETAMINOPHEN 325 MG PO TABS
650.0000 mg | ORAL_TABLET | Freq: Once | ORAL | Status: AC | PRN
  Administered 2022-05-17: 650 mg via ORAL
  Filled 2022-05-17: qty 2

## 2022-05-17 MED ORDER — DEXAMETHASONE SODIUM PHOSPHATE 10 MG/ML IJ SOLN
10.0000 mg | Freq: Once | INTRAMUSCULAR | Status: AC
  Administered 2022-05-17: 10 mg via INTRAMUSCULAR
  Filled 2022-05-17: qty 1

## 2022-05-17 MED ORDER — HYDROCODONE-ACETAMINOPHEN 5-325 MG PO TABS
1.0000 | ORAL_TABLET | Freq: Once | ORAL | Status: AC
  Administered 2022-05-17: 1 via ORAL
  Filled 2022-05-17: qty 1

## 2022-05-17 NOTE — ED Provider Triage Note (Signed)
Emergency Medicine Provider Triage Evaluation Note  Danielle Cherry , a 36 y.o. female  was evaluated in triage.  Pt complains of left otalgia x2 days.  Seen at urgent care and was diagnosed with otitis externa and discharged with eardrops.  Patient admits to fever, chills, myalgias.  Unable to hear out of left ear.  Review of Systems  Positive: otalgia Negative: CP  Physical Exam  BP (!) 148/108 (BP Location: Right Arm)   Pulse (!) 121   Temp (!) 102 F (38.9 C) (Oral)   Resp 20   SpO2 99%  Gen:   Awake, no distress   Resp:  Normal effort  MSK:   Moves extremities without difficulty  Other:  Tenderness throughout mastoid bone. Unable to see TM  Medical Decision Making  Medically screening exam initiated at 10:07 AM.  Appropriate orders placed.  Danielle Cherry was informed that the remainder of the evaluation will be completed by another provider, this initial triage assessment does not replace that evaluation, and the importance of remaining in the ED until their evaluation is complete.  Routine labs COVID CT to rule out mastoiditis   Danielle Bouchard, PA-C 05/17/22 1009

## 2022-05-17 NOTE — ED Provider Notes (Signed)
[facility] EMERGENCY DEPARTMENT Provider Note   CSN: [id number] Arrival date & time: 05/17/22  0945     History  No chief complaint on file.   Danielle Cherry is a 36 y.o. female with history of depression and recurrent ear infections who presents the emergency department complaining of left ear pain that started 2 days ago.  Patient went to urgent care yesterday and they diagnosed her with an infective otitis externa and gave her eardrops.  She presents today stating that her pain worsened, with associated fever and body aches.  HPI     Home Medications Prior to Admission medications   Medication Sig Start Date End Date Taking? Authorizing Provider  amoxicillin-clavulanate (AUGMENTIN) 875-125 MG tablet Take 1 tablet by mouth every 12 (twelve) hours. 05/17/22  Yes Chrisandra Wiemers T, PA-C  ibuprofen (ADVIL) 600 MG tablet Take 1 tablet (600 mg total) by mouth every 6 (six) hours as needed. 03/13/21   Elpidio Anis, PA-C  ofloxacin (FLOXIN) 0.3 % OTIC solution Place 10 drops into the left ear daily. 05/16/22   Carlisle Beers, FNP  oxyCODONE (OXY IR/ROXICODONE) 5 MG immediate release tablet Take 1 tablet (5 mg total) by mouth every 4 (four) hours as needed (pain scale 4-7). 06/11/20   Crumpler, Lilyan Gilford, CNM      Allergies    Strawberry extract and Latex    Review of Systems   Review of Systems  HENT:  Positive for ear pain and hearing loss. Negative for ear discharge.   All other systems reviewed and are negative.   Physical Exam Updated Vital Signs BP 135/86 (BP Location: Right Arm)   Pulse (!) 105   Temp 99 F (37.2 C) (Oral)   Resp 19   SpO2 99%  Physical Exam Vitals and nursing note reviewed.  Constitutional:      Appearance: Normal appearance.  HENT:     Head: Normocephalic and atraumatic.     Right Ear: Hearing, tympanic membrane, ear canal and external ear normal.     Left Ear: Decreased hearing noted. Swelling and tenderness present. No  drainage. There is mastoid tenderness.     Ears:     Comments: Pain with manipulation of external ear. Soft tissue swelling within the canal, no mass visualized Eyes:     Conjunctiva/sclera: Conjunctivae normal.  Pulmonary:     Effort: Pulmonary effort is normal. No respiratory distress.  Skin:    General: Skin is warm and dry.  Neurological:     Mental Status: She is alert.  Psychiatric:        Mood and Affect: Mood normal.        Behavior: Behavior normal.     ED Results / Procedures / Treatments   Labs (all labs ordered are listed, but only abnormal results are displayed) Labs Reviewed  CBC WITH DIFFERENTIAL/PLATELET - Abnormal; Notable for the following components:      Result Value   WBC 11.9 (*)    Neutro Abs 9.4 (*)    Abs Immature Granulocytes 0.15 (*)    All other components within normal limits  BASIC METABOLIC PANEL - Abnormal; Notable for the following components:   Glucose, Bld 121 (*)    All other components within normal limits  URINALYSIS, ROUTINE W REFLEX MICROSCOPIC - Abnormal; Notable for the following components:   APPearance HAZY (*)    Hgb urine dipstick SMALL (*)    Bacteria, UA RARE (*)    All other components within normal  limits  SARS CORONAVIRUS 2 BY RT PCR  LACTIC ACID, PLASMA  LACTIC ACID, PLASMA  I-STAT BETA HCG BLOOD, ED ([facility], [facility], [facility] ONLY)    EKG None  Radiology CT Temporal Bones Wo Contrast  Result Date: 05/17/2022 CLINICAL DATA:  Provided history: Mastoiditis. EXAM: CT TEMPORAL BONES WITHOUT CONTRAST TECHNIQUE: Axial and coronal plane CT imaging of the petrous temporal bones was performed with thin-collimation image reconstruction. No intravenous contrast was administered. Multiplanar CT image reconstructions were also generated. RADIATION DOSE REDUCTION: This exam was performed according to the departmental dose-optimization program which includes automated exposure control, adjustment of the mA and/or kV according to patient size and/or  use of iterative reconstruction technique. COMPARISON:  No pertinent prior exams available for comparison. FINDINGS: RIGHT TEMPORAL BONE External auditory canal: Patent. Middle ear cavity: Normally aerated. The scutum and ossicles are normal. The tegmen tympani is intact. Inner ear structures: The cochlea, vestibule and semicircular canals are normal. The vestibular aqueduct is not enlarged. Internal auditory and facial nerve canals:  Unremarkable. Mastoid air cells: Normally aerated. LEFT TEMPORAL BONE External auditory canal: Soft tissue thickening within the external auditory canal. 6 mm soft tissue attenuation focus obstructing the mid external auditory canal (for instance as seen on series 9, image 143). Middle ear cavity: Extensive partial opacification of the left middle ear cavity. No definite erosion of the ossicles or scutum. The tegmen tympani is intact. Inner ear structures: The cochlea, vestibule and semicircular canals are normal. The vestibular aqueduct is not enlarged. Internal auditory and facial nerve canals:  Unremarkable. Mastoid air cells: Small-volume fluid within the left mastoid air cells. Vascular: Normal non-contrast appearance of the carotid canals, jugular bulbs and sigmoid plates. Limited intracranial:  Partially empty sella turcica. Visible orbits/paranasal sinuses: No acute or significant orbital finding. Small-volume frothy secretions within the right sphenoid sinus. Soft tissues: Soft tissue swelling and inflammatory stranding about the left external ear. IMPRESSION: Soft tissue swelling and inflammatory stranding about the left external ear. Soft tissue thickening within the left external auditory canal. Extensive partial opacification of the left middle ear cavity. Although nonspecific, these findings are compatible with left otitis externa and otitis media in the appropriate clinical setting. 6 mm soft tissue attenuation focus obstructing the mid aspect of the left external  auditory canal. This may reflect cerumen/debris. However, direct visualization is recommended to exclude a mass at this site. Small-volume fluid within the left mastoid air cells. Unremarkable non-contrast CT appearance of the right temporal bone. Mild right sphenoid sinusitis. Electronically Signed   By: Jackey Loge D.O.   On: 05/17/2022 12:37    Procedures Procedures    Medications Ordered in ED Medications  acetaminophen (TYLENOL) tablet 650 mg (650 mg Oral Given 05/17/22 0956)  dexamethasone (DECADRON) injection 10 mg (10 mg Intramuscular Given 05/17/22 1613)  HYDROcodone-acetaminophen (NORCO/VICODIN) 5-325 MG per tablet 1 tablet (1 tablet Oral Given 05/17/22 1613)  amoxicillin-clavulanate (AUGMENTIN) 875-125 MG per tablet 1 tablet (1 tablet Oral Given 05/17/22 1612)    ED Course/ Medical Decision Making/ A&P                           Medical Decision Making Risk OTC drugs. Prescription drug management.  This patient is a 35 y.o. female  who presents to the ED for concern of left ear pain x 2 days.   Differential diagnoses prior to evaluation: The emergent differential diagnosis includes, but is not limited to,  otitis externa (infective vs  malignant), mastoiditis, otitis media, perforated TM.  This is not an exhaustive differential.   Past Medical History / Co-morbidities: Depression, recurrent outer ear infections  Additional history: Chart reviewed. Pertinent results include: Went to UC yesterday, diagnosed with otitis externa and given antibiotic ear drops  Physical Exam: Physical exam performed. The pertinent findings include: Febrile to 102 F, given tylenol and pain medication. Tachycardic to 105. Right ear normal. Left ear with decreased hearing, pain with manipulation of external ear, mastoid tenderness, and inability to visualize TM due to swelling. No mass noted. No drainage.   Lab Tests/Imaging studies: I personally interpreted labs/imaging and the pertinent results  include:  Leukocytosis of 11.9, lab work otherwise unremarkable.   CT temporal bones shows soft tissue swelling and thickening of left ear, compatible with left otitis externa and otitis media. No findings of mastoiditis. I agree with the radiologist interpretation.  Medications: I ordered medication including pain medication, decadron, and augmentin.  I have reviewed the patients home medicines and have made adjustments as needed.   Disposition: After consideration of the diagnostic results and the patients response to treatment, I feel that emergency department workup does not suggest an emergent condition requiring admission or immediate intervention beyond what has been performed at this time. The plan is: discharge to home with oral antibiotics on top of current antibiotic drops. Recommend ibuprofen and tylenol as needed for pain and fever. Recommended ENT follow up for recurrent ear infections. The patient is safe for discharge and has been instructed to return immediately for worsening symptoms, change in symptoms or any other concerns.  Final Clinical Impression(s) / ED Diagnoses Final diagnoses:  Other infective acute otitis externa of left ear  Acute otitis media, unspecified otitis media type    Rx / DC Orders ED Discharge Orders          Ordered    amoxicillin-clavulanate (AUGMENTIN) 875-125 MG tablet  Every 12 hours        05/17/22 1623           Portions of this report may have been transcribed using voice recognition software. Every effort was made to ensure accuracy; however, inadvertent computerized transcription errors may be present.    Estill Cotta 05/17/22 1648    Davonna Belling, MD 05/17/22 2357

## 2022-05-17 NOTE — ED Triage Notes (Signed)
Patient complains of left ear pain Saturday and seen at [facility] and received drops. Now has increased pain, body aches and fever

## 2022-05-17 NOTE — Discharge Instructions (Addendum)
You were seen in the emergency department today for ear pain.  I think that your symptoms got worse because you also developed a middle ear infection.  This requires oral antibiotics as well as the drops that you received at urgent care.  We gave you a dose of pain medication and steroids.  Please use Tylenol or ibuprofen for pain.  You may use 800 mg ibuprofen every 6 hours or 1000 mg of Tylenol every 6 hours.  You may choose to alternate between the two, this would be most effective. Do not exceed 4000 mg of Tylenol within 24 hours.  Do not exceed 3200 mg ibuprofen within 24 hours.  I recommend following up with the ENT doctor regarding your recurrent ear infections.  I have attached their contact information free to call and make a follow-up appointment.  Continue to monitor how you're doing and return to the ER for new or worsening symptoms such as persistent fevers or worsening pain after 2-3 days of antibiotics.

## 2022-08-10 ENCOUNTER — Other Ambulatory Visit: Payer: Self-pay

## 2022-08-10 ENCOUNTER — Emergency Department (HOSPITAL_COMMUNITY): Payer: Medicaid Other

## 2022-08-10 ENCOUNTER — Emergency Department (HOSPITAL_COMMUNITY)
Admission: EM | Admit: 2022-08-10 | Discharge: 2022-08-11 | Disposition: A | Discharge status: Home / Self Care | Payer: Medicaid Other | Attending: Emergency Medicine | Admitting: Emergency Medicine

## 2022-08-10 DIAGNOSIS — N132 Hydronephrosis with renal and ureteral calculous obstruction: Secondary | ICD-10-CM | POA: Insufficient documentation

## 2022-08-10 DIAGNOSIS — D72829 Elevated white blood cell count, unspecified: Secondary | ICD-10-CM | POA: Insufficient documentation

## 2022-08-10 DIAGNOSIS — R109 Unspecified abdominal pain: Secondary | ICD-10-CM | POA: Diagnosis present

## 2022-08-10 DIAGNOSIS — N200 Calculus of kidney: Secondary | ICD-10-CM

## 2022-08-10 DIAGNOSIS — Z91040 Latex allergy status: Secondary | ICD-10-CM | POA: Diagnosis not present

## 2022-08-10 LAB — COMPREHENSIVE METABOLIC PANEL
ALT: 20 U/L (ref 0–44)
AST: 16 U/L (ref 15–41)
Albumin: 3.8 g/dL (ref 3.5–5.0)
Alkaline Phosphatase: 50 U/L (ref 38–126)
Anion gap: 5 (ref 5–15)
BUN: 11 mg/dL (ref 6–20)
CO2: 26 mmol/L (ref 22–32)
Calcium: 9.2 mg/dL (ref 8.9–10.3)
Chloride: 109 mmol/L (ref 98–111)
Creatinine, Ser: 0.75 mg/dL (ref 0.44–1.00)
GFR, Estimated: >60 mL/min (ref >60)
Glucose, Bld: 93 mg/dL (ref 70–99)
Potassium: 3.9 mmol/L (ref 3.5–5.1)
Sodium: 140 mmol/L (ref 135–145)
Total Bilirubin: 0.4 mg/dL (ref 0.3–1.2)
Total Protein: 6.6 g/dL (ref 6.5–8.1)

## 2022-08-10 LAB — CBC WITH DIFFERENTIAL/PLATELET
Abs Immature Granulocytes: 0.05 10*3/uL (ref 0.00–0.07)
Basophils Absolute: 0 10*3/uL (ref 0.0–0.1)
Basophils Relative: 0 %
Eosinophils Absolute: 0.2 10*3/uL (ref 0.0–0.5)
Eosinophils Relative: 2 %
HCT: 42.9 % (ref 36.0–46.0)
Hemoglobin: 13.8 g/dL (ref 12.0–15.0)
Immature Granulocytes: 0 %
Lymphocytes Relative: 33 %
Lymphs Abs: 4 10*3/uL (ref 0.7–4.0)
MCH: 30.4 pg (ref 26.0–34.0)
MCHC: 32.2 g/dL (ref 30.0–36.0)
MCV: 94.5 fL (ref 80.0–100.0)
Monocytes Absolute: 0.7 10*3/uL (ref 0.1–1.0)
Monocytes Relative: 6 %
Neutro Abs: 7 10*3/uL (ref 1.7–7.7)
Neutrophils Relative %: 59 %
Platelets: 267 10*3/uL (ref 150–400)
RBC: 4.54 MIL/uL (ref 3.87–5.11)
RDW: 12.3 % (ref 11.5–15.5)
WBC: 12 10*3/uL — ABNORMAL HIGH (ref 4.0–10.5)
nRBC: 0 % (ref 0.0–0.2)

## 2022-08-10 LAB — URINALYSIS, ROUTINE W REFLEX MICROSCOPIC
Bilirubin Urine: NEGATIVE
Glucose, UA: NEGATIVE mg/dL
Ketones, ur: NEGATIVE mg/dL
Leukocytes,Ua: NEGATIVE
Nitrite: NEGATIVE
Protein, ur: NEGATIVE mg/dL
Specific Gravity, Urine: 1.005 (ref 1.005–1.030)
pH: 6 (ref 5.0–8.0)

## 2022-08-10 LAB — LIPASE, BLOOD: Lipase: 43 U/L (ref 11–51)

## 2022-08-10 LAB — I-STAT BETA HCG BLOOD, ED (MC, WL, AP ONLY): I-stat hCG, quantitative: <5 m[IU]/mL (ref <5)

## 2022-08-10 NOTE — ED Triage Notes (Signed)
Pain with urination for 1.5 wk, now blood starting today along with bilateral flank pain.  No h/o UTI or kidney stones to compare pain to.  No daily medications

## 2022-08-10 NOTE — ED Provider Triage Note (Signed)
Emergency Medicine Provider Triage Evaluation Note  Danielle Cherry , a 36 y.o. female  was evaluated in triage.  Pt complains of bilateral flank pain, hematuria.  Patient states symptoms been ongoing for approximate 1 week.  She thought she may have a UTI and today noticed some blood with urinating.  She denies nausea, vomiting, shortness of breath  Review of Systems  Positive: As above Negative: As above  Physical Exam  BP (!) 130/97 (BP Location: Right Arm)   Pulse 71   Temp 99.1 F (37.3 C) (Oral)   Resp 16   SpO2 100%  Gen:   Awake, no distress   Resp:  Normal effort  MSK:   Moves extremities without difficulty  Other:    Medical Decision Making  Medically screening exam initiated at 7:50 PM.  Appropriate orders placed.  Danielle Cherry was informed that the remainder of the evaluation will be completed by another provider, this initial triage assessment does not replace that evaluation, and the importance of remaining in the ED until their evaluation is complete.     Darrick Grinder, PA-C 08/10/22 1950

## 2022-08-11 MED ORDER — IBUPROFEN 600 MG PO TABS: 600.0000 mg | ORAL_TABLET | Freq: Four times a day (QID) | ORAL | 0 refills | Status: AC | PRN

## 2022-08-11 MED ORDER — OXYCODONE-ACETAMINOPHEN 5-325 MG PO TABS
2.0000 | ORAL_TABLET | Freq: Once | ORAL | Status: AC
  Administered 2022-08-11: 2 via ORAL
  Filled 2022-08-11: qty 2

## 2022-08-11 MED ORDER — OXYCODONE-ACETAMINOPHEN 5-325 MG PO TABS: 1.0000 | ORAL_TABLET | Freq: Three times a day (TID) | ORAL | 0 refills | Status: DC | PRN

## 2022-08-11 MED ORDER — OXYCODONE-ACETAMINOPHEN 5-325 MG PO TABS
1.0000 | ORAL_TABLET | Freq: Once | ORAL | Status: DC
  Filled 2022-08-11: qty 1

## 2022-08-11 MED ORDER — ONDANSETRON 4 MG PO TBDP: 4.0000 mg | ORAL_TABLET | Freq: Three times a day (TID) | ORAL | 0 refills | Status: AC | PRN

## 2022-08-11 MED ORDER — OXYCODONE-ACETAMINOPHEN 5-325 MG PO TABS
1.0000 | ORAL_TABLET | Freq: Once | ORAL | Status: AC
  Administered 2022-08-11: 1 via ORAL
  Filled 2022-08-11: qty 1

## 2022-08-11 MED ORDER — ONDANSETRON 4 MG PO TBDP
4.0000 mg | ORAL_TABLET | Freq: Once | ORAL | Status: AC
  Administered 2022-08-11: 4 mg via ORAL
  Filled 2022-08-11: qty 1

## 2022-08-11 NOTE — ED Provider Notes (Signed)
[facility] EMERGENCY DEPARTMENT Provider Note   CSN: [id number] Arrival date & time: 08/10/22  1840     History  Chief Complaint  Patient presents with   Flank Pain    Danielle Cherry is a 36 y.o. female.  Intermittent right flank pain for the past 2 days.  Did have some pain to her left side but now is mostly on her right.  Denies any fall or trauma.  Did have some pain with urination and vaginal pain which is now resolved.  The past couple days she has noticed some increased blood in her urine and pain with urination.  Most of her pain is to her right flank now.  She is never had a kidney stone before.  Denies any nausea, vomiting, chest pain, shortness of breath.  No vaginal bleeding or discharge.  Does have some blood in the urine as well as some dysuria.  No fever.  The history is provided by the patient.  Flank Pain Pertinent negatives include no chest pain, no abdominal pain, no headaches and no shortness of breath.       Home Medications Prior to Admission medications   Medication Sig Start Date End Date Taking? Authorizing Provider  amoxicillin-clavulanate (AUGMENTIN) 875-125 MG tablet Take 1 tablet by mouth every 12 (twelve) hours. 05/17/22   Roemhildt, Lorin T, PA-C  ibuprofen (ADVIL) 600 MG tablet Take 1 tablet (600 mg total) by mouth every 6 (six) hours as needed. 03/13/21   Charlann Lange, PA-C  ofloxacin (FLOXIN) 0.3 % OTIC solution Place 10 drops into the left ear daily. 05/16/22   Talbot Grumbling, FNP  oxyCODONE (OXY IR/ROXICODONE) 5 MG immediate release tablet Take 1 tablet (5 mg total) by mouth every 4 (four) hours as needed (pain scale 4-7). 06/11/20   Crumpler, Marveen Reeks, CNM      Allergies    Strawberry extract and Latex    Review of Systems   Review of Systems  Constitutional:  Negative for activity change, appetite change and fever.  HENT:  Negative for congestion and rhinorrhea.   Respiratory:  Negative for cough, chest tightness  and shortness of breath.   Cardiovascular:  Negative for chest pain.  Gastrointestinal:  Negative for abdominal pain, nausea and vomiting.  Genitourinary:  Positive for dysuria, flank pain, hematuria and urgency.  Musculoskeletal:  Positive for back pain. Negative for arthralgias and myalgias.  Neurological:  Negative for weakness and headaches.   all other systems are negative except as noted in the HPI and PMH.    Physical Exam Updated Vital Signs BP 123/76 (BP Location: Left Arm)   Pulse 81   Temp 98.3 F (36.8 C) (Oral)   Resp 15   Wt 64.9 kg   SpO2 97%   BMI 28.88 kg/m  Physical Exam Vitals and nursing note reviewed.  Constitutional:      General: She is not in acute distress.    Appearance: She is well-developed.  HENT:     Head: Normocephalic and atraumatic.     Mouth/Throat:     Pharynx: No oropharyngeal exudate.  Eyes:     Conjunctiva/sclera: Conjunctivae normal.     Pupils: Pupils are equal, round, and reactive to light.  Neck:     Comments: No meningismus. Cardiovascular:     Rate and Rhythm: Normal rate and regular rhythm.     Heart sounds: Normal heart sounds. No murmur heard. Pulmonary:     Effort: Pulmonary effort is normal. No respiratory distress.  Breath sounds: Normal breath sounds.  Abdominal:     Palpations: Abdomen is soft.     Tenderness: There is abdominal tenderness. There is no guarding or rebound.     Comments: Suprapubic tenderness No RLQ tenderness  Musculoskeletal:        General: Tenderness present. Normal range of motion.     Cervical back: Normal range of motion and neck supple.     Comments: R CVAT  Skin:    General: Skin is warm.  Neurological:     Mental Status: She is alert and oriented to person, place, and time.     Cranial Nerves: No cranial nerve deficit.     Motor: No abnormal muscle tone.     Coordination: Coordination normal.     Comments:  5/5 strength throughout. CN 2-12 intact.Equal grip strength.    Psychiatric:        Behavior: Behavior normal.     ED Results / Procedures / Treatments   Labs (all labs ordered are listed, but only abnormal results are displayed) Labs Reviewed  CBC WITH DIFFERENTIAL/PLATELET - Abnormal; Notable for the following components:      Result Value   WBC 12.0 (*)    All other components within normal limits  URINALYSIS, ROUTINE W REFLEX MICROSCOPIC - Abnormal; Notable for the following components:   Color, Urine STRAW (*)    Hgb urine dipstick MODERATE (*)    Bacteria, UA RARE (*)    All other components within normal limits  URINE CULTURE  COMPREHENSIVE METABOLIC PANEL  LIPASE, BLOOD  I-STAT BETA HCG BLOOD, ED ([facility], [facility], [facility] ONLY)    EKG None  Radiology CT ABDOMEN PELVIS WO CONTRAST  Result Date: 08/10/2022 CLINICAL DATA:  Bilateral flank pain, hematuria EXAM: CT ABDOMEN AND PELVIS WITHOUT CONTRAST TECHNIQUE: Multidetector CT imaging of the abdomen and pelvis was performed following the standard protocol without IV contrast. RADIATION DOSE REDUCTION: This exam was performed according to the departmental dose-optimization program which includes automated exposure control, adjustment of the mA and/or kV according to patient size and/or use of iterative reconstruction technique. COMPARISON:  None Available. FINDINGS: Lower chest: No acute abnormality. Hepatobiliary: Mild hepatic steatosis. No enhancing intrahepatic mass. No intra or extrahepatic biliary ductal dilation. Gallbladder unremarkable. Pancreas: Unremarkable Spleen: Unremarkable Adrenals/Urinary Tract: The adrenal glands are unremarkable. The kidneys are normal in size and position. An obstructing 2 mm calculus is seen within the proximal right ureter resulting in mild right hydronephrosis. No additional renal or ureteral calculi. No hydronephrosis on the left. No significant perinephric inflammatory stranding or loculated perinephric fluid collections are identified. The bladder is decompressed  and is unremarkable. Stomach/Bowel: Stomach is within normal limits. Appendix appears normal. No evidence of bowel wall thickening, distention, or inflammatory changes. Vascular/Lymphatic: No significant vascular findings are present. No enlarged abdominal or pelvic lymph nodes. Reproductive: Uterus and bilateral adnexa are unremarkable. Other: No abdominal wall hernia or abnormality. No abdominopelvic ascites. Musculoskeletal: Degenerative changes are seen at L5-S1. No acute bone abnormality. No lytic or blastic bone lesion. IMPRESSION: 1. Obstructing 2 mm calculus within the proximal right ureter resulting in mild right hydronephrosis. 2. Mild hepatic steatosis. Electronically Signed   By: Fidela Salisbury M.D.   On: 08/10/2022 21:56    Procedures Procedures    Medications Ordered in ED Medications  oxyCODONE-acetaminophen (PERCOCET/ROXICET) 5-325 MG per tablet 1 tablet (1 tablet Oral Not Given 08/11/22 1028)  oxyCODONE-acetaminophen (PERCOCET/ROXICET) 5-325 MG per tablet 2 tablet (2 tablets Oral Given 08/11/22 0217)  oxyCODONE-acetaminophen (PERCOCET/ROXICET) 5-325 MG per tablet 1 tablet (1 tablet Oral Given 08/11/22 1035)  ondansetron (ZOFRAN-ODT) disintegrating tablet 4 mg (4 mg Oral Given 08/11/22 1035)    ED Course/ Medical Decision Making/ A&P                           Medical Decision Making Amount and/or Complexity of Data Reviewed Labs: ordered. Decision-making details documented in ED Course. Radiology: ordered and independent interpretation performed. Decision-making details documented in ED Course. ECG/medicine tests: ordered and independent interpretation performed. Decision-making details documented in ED Course.  Risk Prescription drug management.  R flank pain with hematuria since yesterday. Vitals stable, no distress.   UA with hematuria. No infection. Culture sent.   Labs with chronic leukocytosis.  CT scan obtained in triage shows 2 mm right ureteral stone which is  likely explains her symptoms.  Results reviewed interpreted by me. Creatinine is at baseline.  Pain and nausea are well-controlled.  Suspect kidney stone is the source of her pain.  Discussed symptom control with pain and nausea medications and urology follow-up.  Return to the ED sooner with intractable pain, intractable nausea, fever, not able to urinate or other concerns.        Final Clinical Impression(s) / ED Diagnoses Final diagnoses:  Kidney stone    Rx / DC Orders ED Discharge Orders     None         Valentine Kuechle, Jeannett Senior, MD 08/11/22 1049

## 2022-08-11 NOTE — Discharge Instructions (Addendum)
Take the pain and nausea medication as prescribed.  Follow-up with the urologist.  Return to the ED with difficulty with urination, fever, not able to urinate, intractable pain or vomiting or other concerns.

## 2022-08-11 NOTE — ED Notes (Signed)
Pt ambulated to restroom with no complaints.  

## 2022-08-12 LAB — URINE CULTURE: Culture: <10000 — AB

## 2023-01-07 ENCOUNTER — Other Ambulatory Visit: Payer: Self-pay

## 2023-01-07 ENCOUNTER — Emergency Department (HOSPITAL_COMMUNITY)
Admission: EM | Admit: 2023-01-07 | Discharge: 2023-01-07 | Disposition: A | Discharge status: Home / Self Care | Payer: Medicaid Other | Attending: Emergency Medicine | Admitting: Emergency Medicine

## 2023-01-07 ENCOUNTER — Encounter (HOSPITAL_COMMUNITY): Payer: Self-pay | Admitting: Emergency Medicine

## 2023-01-07 DIAGNOSIS — H60502 Unspecified acute noninfective otitis externa, left ear: Secondary | ICD-10-CM

## 2023-01-07 DIAGNOSIS — H60592 Other noninfective acute otitis externa, left ear: Secondary | ICD-10-CM | POA: Insufficient documentation

## 2023-01-07 DIAGNOSIS — H9202 Otalgia, left ear: Secondary | ICD-10-CM | POA: Diagnosis present

## 2023-01-07 DIAGNOSIS — Z91040 Latex allergy status: Secondary | ICD-10-CM | POA: Insufficient documentation

## 2023-01-07 MED ORDER — OFLOXACIN 0.3 % OP SOLN
5.0000 [drp] | Freq: Every day | OPHTHALMIC | Status: DC
  Administered 2023-01-07: 5 [drp] via OTIC
  Filled 2023-01-07: qty 5

## 2023-01-07 MED ORDER — KETOROLAC TROMETHAMINE 60 MG/2ML IM SOLN
60.0000 mg | Freq: Once | INTRAMUSCULAR | Status: AC
  Administered 2023-01-07: 60 mg via INTRAMUSCULAR
  Filled 2023-01-07: qty 2

## 2023-01-07 MED ORDER — AMOXICILLIN-POT CLAVULANATE 875-125 MG PO TABS
1.0000 | ORAL_TABLET | Freq: Once | ORAL | Status: AC
  Administered 2023-01-07: 1 via ORAL
  Filled 2023-01-07: qty 1

## 2023-01-07 MED ORDER — DEXAMETHASONE SODIUM PHOSPHATE 10 MG/ML IJ SOLN: 10.0000 mg | Freq: Once | INTRAMUSCULAR | Status: DC

## 2023-01-07 MED ORDER — OFLOXACIN 0.3 % OT SOLN: 5.0000 [drp] | Freq: Two times a day (BID) | OTIC | 0 refills | Status: DC

## 2023-01-07 MED ORDER — AMOXICILLIN-POT CLAVULANATE 875-125 MG PO TABS: 1.0000 | ORAL_TABLET | Freq: Two times a day (BID) | ORAL | 0 refills | Status: DC

## 2023-01-07 MED ORDER — DEXAMETHASONE SODIUM PHOSPHATE 10 MG/ML IJ SOLN
10.0000 mg | Freq: Once | INTRAMUSCULAR | Status: AC
  Administered 2023-01-07: 10 mg via INTRAMUSCULAR
  Filled 2023-01-07: qty 1

## 2023-01-07 MED ORDER — OXYCODONE-ACETAMINOPHEN 5-325 MG PO TABS
1.0000 | ORAL_TABLET | Freq: Once | ORAL | Status: AC
  Administered 2023-01-07: 1 via ORAL
  Filled 2023-01-07: qty 1

## 2023-01-07 NOTE — ED Triage Notes (Signed)
Pt c/o left sided ear pain that started around Wednesday. Sts hx of recurrent ear infections.

## 2023-01-07 NOTE — ED Provider Notes (Signed)
[place] EMERGENCY DEPARTMENT AT [facility] Provider Note   CSN: [id number] Arrival date & time: 01/07/23  0409     History  Chief Complaint  Patient presents with   Ear Pain    L    Danielle Cherry is a 37 y.o. female.  2-3 days of recurrent left ear pain. Has h/o otitis externa and media multiple times in last couple years. Has seen ENT for same. UC and here as well. Decadron, drops and agumentin usually help. No fevers. No difficulty with swallowing, breathing or opening jaw. No trauma. Does scratch her ears when they itch, but covers when bathing. No pools or submerging in the bath.         Home Medications Prior to Admission medications   Medication Sig Start Date End Date Taking? Authorizing Provider  amoxicillin-clavulanate (AUGMENTIN) 875-125 MG tablet Take 1 tablet by mouth every 12 (twelve) hours. 01/07/23  Yes Sully Dyment, Barbara Cower, MD  ofloxacin (FLOXIN) 0.3 % OTIC solution Place 5 drops into the left ear 2 (two) times daily. 01/07/23  Yes Sherby Moncayo, Barbara Cower, MD  ibuprofen (ADVIL) 600 MG tablet Take 1 tablet (600 mg total) by mouth every 6 (six) hours as needed. 08/11/22   Rancour, Jeannett Senior, MD  ondansetron (ZOFRAN-ODT) 4 MG disintegrating tablet Take 1 tablet (4 mg total) by mouth every 8 (eight) hours as needed for nausea or vomiting. 08/11/22   Rancour, Jeannett Senior, MD      Allergies    Strawberry extract and Latex    Review of Systems   Review of Systems  Physical Exam Updated Vital Signs BP (!) 132/93 (BP Location: Right Arm)   Pulse 95   Temp 98.7 F (37.1 C)   Resp 15   Ht 4\' 11"  (1.499 m)   Wt 64.9 kg   LMP 11/05/2022   SpO2 97%   BMI 28.88 kg/m  Physical Exam Vitals and nursing note reviewed.  Constitutional:      Appearance: She is well-developed.  HENT:     Head: Normocephalic and atraumatic.     Right Ear: No drainage. No middle ear effusion.     Left Ear: Drainage, swelling and tenderness present. No mastoid tenderness.  Cardiovascular:      Rate and Rhythm: Normal rate and regular rhythm.  Pulmonary:     Effort: No respiratory distress.     Breath sounds: No stridor.  Abdominal:     General: There is no distension.  Musculoskeletal:     Cervical back: Normal range of motion.  Neurological:     Mental Status: She is alert.     ED Results / Procedures / Treatments   Labs (all labs ordered are listed, but only abnormal results are displayed) Labs Reviewed - No data to display  EKG None  Radiology No results found.  Procedures Procedures    Medications Ordered in ED Medications  ofloxacin (OCUFLOX) 0.3 % ophthalmic solution 5 drop (has no administration in time range)  amoxicillin-clavulanate (AUGMENTIN) 875-125 MG per tablet 1 tablet (1 tablet Oral Given 01/07/23 0552)  ketorolac (TORADOL) injection 60 mg (60 mg Intramuscular Given 01/07/23 0553)  oxyCODONE-acetaminophen (PERCOCET/ROXICET) 5-325 MG per tablet 1 tablet (1 tablet Oral Given 01/07/23 0552)  dexamethasone (DECADRON) injection 10 mg (10 mg Intramuscular Given 01/07/23 0553)    ED Course/ Medical Decision Making/ A&P                             Medical  Decision Making Risk Prescription drug management.   Definitely has acute otitis externa on the left we will start some ofloxacin.  She quest Decadron shots and she got that last time it helped a lot.  I cannot visualize her tympanic membrane but she has had multiple acute otitis media's in the past associated with her otitis externa so started on Augmentin as well.  Will refer back to ENT for further evaluation as needed if not improving.  Final Clinical Impression(s) / ED Diagnoses Final diagnoses:  Acute otitis externa of left ear, unspecified type    Rx / DC Orders ED Discharge Orders          Ordered    amoxicillin-clavulanate (AUGMENTIN) 875-125 MG tablet  Every 12 hours        01/07/23 0547    ofloxacin (FLOXIN) 0.3 % OTIC solution  2 times daily        01/07/23 0547               Jahna Liebert, Barbara Cower, MD 01/07/23 [phone]

## 2023-01-09 ENCOUNTER — Emergency Department (HOSPITAL_COMMUNITY)
Admission: EM | Admit: 2023-01-09 | Discharge: 2023-01-09 | Disposition: A | Discharge status: Home / Self Care | Payer: Medicaid Other | Attending: Emergency Medicine | Admitting: Emergency Medicine

## 2023-01-09 ENCOUNTER — Emergency Department (HOSPITAL_COMMUNITY): Payer: Medicaid Other

## 2023-01-09 ENCOUNTER — Encounter (HOSPITAL_COMMUNITY): Payer: Self-pay

## 2023-01-09 DIAGNOSIS — H9201 Otalgia, right ear: Secondary | ICD-10-CM | POA: Diagnosis present

## 2023-01-09 DIAGNOSIS — H6091 Unspecified otitis externa, right ear: Secondary | ICD-10-CM | POA: Diagnosis not present

## 2023-01-09 DIAGNOSIS — R799 Abnormal finding of blood chemistry, unspecified: Secondary | ICD-10-CM | POA: Diagnosis not present

## 2023-01-09 DIAGNOSIS — H60503 Unspecified acute noninfective otitis externa, bilateral: Secondary | ICD-10-CM

## 2023-01-09 LAB — CBG MONITORING, ED: Glucose-Capillary: 92 mg/dL (ref 70–99)

## 2023-01-09 LAB — I-STAT BETA HCG BLOOD, ED (MC, WL, AP ONLY): I-stat hCG, quantitative: <5 m[IU]/mL (ref <5)

## 2023-01-09 LAB — I-STAT CHEM 8, ED
BUN: 24 mg/dL — ABNORMAL HIGH (ref 6–20)
Calcium, Ion: 1.13 mmol/L — ABNORMAL LOW (ref 1.15–1.40)
Chloride: 104 mmol/L (ref 98–111)
Creatinine, Ser: 0.7 mg/dL (ref 0.44–1.00)
Glucose, Bld: 86 mg/dL (ref 70–99)
HCT: 39 % (ref 36.0–46.0)
Hemoglobin: 13.3 g/dL (ref 12.0–15.0)
Potassium: 3.8 mmol/L (ref 3.5–5.1)
Sodium: 138 mmol/L (ref 135–145)
TCO2: 26 mmol/L (ref 22–32)

## 2023-01-09 MED ORDER — DEXAMETHASONE SODIUM PHOSPHATE 10 MG/ML IJ SOLN: 10.0000 mg | Freq: Once | INTRAMUSCULAR | Status: DC

## 2023-01-09 MED ORDER — OXYCODONE-ACETAMINOPHEN 5-325 MG PO TABS
2.0000 | ORAL_TABLET | Freq: Once | ORAL | Status: AC
  Administered 2023-01-09: 2 via ORAL
  Filled 2023-01-09: qty 2

## 2023-01-09 MED ORDER — DEXAMETHASONE SODIUM PHOSPHATE 10 MG/ML IJ SOLN
10.0000 mg | Freq: Once | INTRAMUSCULAR | Status: AC
  Administered 2023-01-09: 10 mg via INTRAVENOUS
  Filled 2023-01-09: qty 1

## 2023-01-09 NOTE — ED Notes (Signed)
Pt received for care at 1900.  Quietly resting in bed; respirations even and unlabored.  This RN introduced self to pt.  Bed in lowest position, wheels locked.  Call bell within reach. 

## 2023-01-09 NOTE — ED Triage Notes (Signed)
Pt states that since being seen 2 days ago, she has started having right ear pain, in addition to the left ear pain. Pt has been taking abx ear drops  as prescribed.  Pt got a steroid shot on Friday which helped, requesting steroid injection.

## 2023-01-09 NOTE — ED Notes (Signed)
Pt provided with AVS.  Education complete; all questions answered.  Pt leaving ED in stable condition at this time, ambulatory with all belongings. 

## 2023-01-09 NOTE — ED Notes (Signed)
Patient transported to CT 

## 2023-01-09 NOTE — Discharge Instructions (Signed)
Continue the topical antibiotics as well as antibiotics by mouth.  Follow-up with the ear nose and throat doctor for recheck this week.  Use Tylenol or ibuprofen as needed for pain and fever.  Return to the ED with difficulty breathing, difficulty swallowing or other concerns

## 2023-01-09 NOTE — ED Provider Notes (Signed)
[place] EMERGENCY DEPARTMENT AT [facility] Provider Note   CSN: [id number] Arrival date & time: 01/09/23  1326     History  Chief Complaint  Patient presents with   Otalgia    Danielle Cherry is a 37 y.o. female.  Patient with a history of recurrent ear infections here with right ear pain and swelling.  She was seen 2 days ago for infection of the left ear and prescribed ofloxacin as well as Augmentin.  Has been compliant with this and feels like her left ear is getting better.  She now develops pain to her right ear and difficulty opening her mouth secondary to pain.  There is no bleeding or drainage from either ear.  No fever.  No chest pain or shortness of breath.  No abdominal pain, nausea or vomiting.  She is requesting other steroid shot due to swelling in her ears which helped on Friday.  Has not seen ENT.  Denies any history of diabetes. Most of her pain is to her right tragus and mastoid area.  The history is provided by the patient.  Otalgia Associated symptoms: ear discharge   Associated symptoms: no abdominal pain, no cough, no headaches, no rash, no sore throat and no vomiting        Home Medications Prior to Admission medications   Medication Sig Start Date End Date Taking? Authorizing Provider  amoxicillin-clavulanate (AUGMENTIN) 875-125 MG tablet Take 1 tablet by mouth every 12 (twelve) hours. 01/07/23   Mesner, Barbara Cower, Danielle Cherry  ibuprofen (ADVIL) 600 MG tablet Take 1 tablet (600 mg total) by mouth every 6 (six) hours as needed. 08/11/22   Danielle Cherry, Danielle Senior, Danielle Cherry  ofloxacin (FLOXIN) 0.3 % OTIC solution Place 5 drops into the left ear 2 (two) times daily. 01/07/23   Mesner, Barbara Cower, Danielle Cherry  ondansetron (ZOFRAN-ODT) 4 MG disintegrating tablet Take 1 tablet (4 mg total) by mouth every 8 (eight) hours as needed for nausea or vomiting. 08/11/22   Maeby Vankleeck, Danielle Senior, Danielle Cherry      Allergies    Strawberry extract and Latex    Review of Systems   Review of Systems  Constitutional:   Negative for activity change, appetite change and chills.  HENT:  Positive for ear discharge and ear pain. Negative for sinus pain, sore throat and trouble swallowing.   Respiratory:  Negative for cough and shortness of breath.   Gastrointestinal:  Negative for abdominal pain, nausea and vomiting.  Genitourinary:  Negative for dysuria and hematuria.  Skin:  Negative for rash.  Neurological:  Negative for dizziness, weakness and headaches.   all other systems are negative except as noted in the HPI and PMH.    Physical Exam Updated Vital Signs BP 106/70   Pulse 91   Temp 98.4 F (36.9 C) (Oral)   Resp 16   Ht 4\' 11"  (1.499 m)   Wt 64.9 kg   LMP 11/05/2022   SpO2 100%   BMI 28.88 kg/m  Physical Exam Vitals and nursing note reviewed.  Constitutional:      General: She is not in acute distress.    Appearance: She is well-developed.  HENT:     Head: Normocephalic and atraumatic.     Ears:     Comments: Right tragus and mastoid pain.  TM is visible and mildly erythematous.  Left auditory canal obscured by debris.  TM not visible.  Positive tragus tenderness, no mastoid tenderness      Mouth/Throat:     Pharynx: No oropharyngeal exudate.  Eyes:     Conjunctiva/sclera: Conjunctivae normal.     Pupils: Pupils are equal, round, and reactive to light.  Neck:     Comments: No meningismus. Cardiovascular:     Rate and Rhythm: Normal rate and regular rhythm.     Heart sounds: Normal heart sounds. No murmur heard. Pulmonary:     Effort: Pulmonary effort is normal. No respiratory distress.     Breath sounds: Normal breath sounds.  Abdominal:     Palpations: Abdomen is soft.     Tenderness: There is no abdominal tenderness. There is no guarding or rebound.  Musculoskeletal:        General: No tenderness. Normal range of motion.     Cervical back: Normal range of motion and neck supple.  Skin:    General: Skin is warm.  Neurological:     Mental Status: She is alert and  oriented to person, place, and time.     Cranial Nerves: No cranial nerve deficit.     Motor: No abnormal muscle tone.     Coordination: Coordination normal.     Comments:  5/5 strength throughout. CN 2-12 intact.Equal grip strength.   Psychiatric:        Behavior: Behavior normal.     ED Results / Procedures / Treatments   Labs (all labs ordered are listed, but only abnormal results are displayed) Labs Reviewed  I-STAT CHEM 8, ED - Abnormal; Notable for the following components:      Result Value   BUN 24 (*)    Calcium, Ion 1.13 (*)    All other components within normal limits  CBG MONITORING, ED  I-STAT BETA HCG BLOOD, ED ([facility], [facility], [facility] ONLY)    EKG None  Radiology CT TEMPORAL BONES WO CONTRAST  Result Date: 01/09/2023 CLINICAL DATA:  Mastoiditis. EXAM: CT TEMPORAL BONES WITHOUT CONTRAST TECHNIQUE: Axial and coronal plane CT imaging of the petrous temporal bones was performed with thin-collimation image reconstruction. No intravenous contrast was administered. Multiplanar CT image reconstructions were also generated. RADIATION DOSE REDUCTION: This exam was performed according to the departmental dose-optimization program which includes automated exposure control, adjustment of the mA and/or kV according to patient size and/or use of iterative reconstruction technique. COMPARISON:  Temporal bone CT 05/17/2022 FINDINGS: RIGHT TEMPORAL BONE External auditory canal: New diffuse soft tissue thickening narrowing the EAC. No associated osseous erosion. Thickening of the tympanic membrane. Middle ear cavity: New soft tissue or other material partially surrounding the ossicles and in Prussak's space with the ossicles, scutum, and tegmen tympani appearing intact. Inner ear structures: The cochlea, vestibule and semicircular canals are normal. The vestibular aqueduct is not enlarged. Internal auditory and facial nerve canals:  Normal. Mastoid air cells: New mild scattered mastoid air cell  opacification without coalescence. LEFT TEMPORAL BONE External auditory canal: Diffuse soft tissue thickening, greater than on the right side but less than on the 2023 CT. Cerumen or other debris in the more medial aspect of the EAC. No associated osseous erosion. Middle ear cavity: Soft tissue or other material partially surrounding the ossicles and in Prussak's space, overall less than on the prior CT with the ossicles, scutum, and tegmen tympani appearing intact. Inner ear structures: The cochlea, vestibule and semicircular canals are normal. The vestibular aqueduct is not enlarged. Internal auditory and facial nerve canals:  Normal. Mastoid air cells: Mild scattered mastoid air cell opacification, similar in extent to the prior CT and without coalescence. Vascular: Normal non-contrast appearance of the carotid canals,  jugular bulbs and sigmoid plates. Limited intracranial:  No acute abnormality. Visible orbits/paranasal sinuses: Unremarkable included orbits. Small volume secretions in the right sphenoid sinus. Soft tissues: Bilateral external ear soft tissue swelling. IMPRESSION: Soft tissue thickening/swelling involving the left greater than right external auditory canals compatible with otitis externa. Partial opacification of the middle ear cavities with small mastoid effusions. No osseous erosion. Electronically Signed   By: Sebastian Ache M.D.   On: 01/09/2023 18:41    Procedures Procedures    Medications Ordered in ED Medications  dexamethasone (DECADRON) injection 10 mg (has no administration in time range)  oxyCODONE-acetaminophen (PERCOCET/ROXICET) 5-325 MG per tablet 2 tablet (has no administration in time range)    ED Course/ Medical Decision Making/ A&P                             Medical Decision Making Amount and/or Complexity of Data Reviewed Labs: ordered. Decision-making details documented in ED Course. Radiology: ordered and independent interpretation performed. Decision-making  details documented in ED Course. ECG/medicine tests: ordered and independent interpretation performed. Decision-making details documented in ED Course.  Risk Prescription drug management.   Right ear pain and swelling consistent with otitis externa.  Recently treated for same on left.  No fever.  No meningismus.  Patient given IV steroids per her request.  She is already on topical and p.o. antibiotics.  CT obtained given worsening swelling for years and worsening pain to rule out deep space infection.  This shows evidence of otitis externa without drainable abscess or fluid collection.  Continue topical and p.o. antibiotics and follow-up with ENT next week as scheduled.  Return to ED with new or worsening symptoms.        Final Clinical Impression(s) / ED Diagnoses Final diagnoses:  None    Rx / DC Orders ED Discharge Orders     None         Kadie Balestrieri, Danielle Senior, Danielle Cherry 01/09/23 1946

## 2023-09-28 ENCOUNTER — Ambulatory Visit: Payer: Self-pay

## 2023-09-30 ENCOUNTER — Ambulatory Visit
Admission: RE | Admit: 2023-09-30 | Discharge: 2023-09-30 | Disposition: A | Discharge status: Home / Self Care | Payer: Medicaid Other | Source: Ambulatory Visit

## 2023-09-30 VITALS — BP 131/89 | HR 92 | Temp 98.1°F | Resp 20

## 2023-09-30 DIAGNOSIS — H60393 Other infective otitis externa, bilateral: Secondary | ICD-10-CM

## 2023-09-30 DIAGNOSIS — R051 Acute cough: Secondary | ICD-10-CM | POA: Diagnosis not present

## 2023-09-30 DIAGNOSIS — U071 COVID-19: Secondary | ICD-10-CM

## 2023-09-30 MED ORDER — BENZONATATE 100 MG PO CAPS: 100.0000 mg | ORAL_CAPSULE | Freq: Three times a day (TID) | ORAL | 0 refills | Status: AC

## 2023-09-30 MED ORDER — PROMETHAZINE-DM 6.25-15 MG/5ML PO SYRP: 5.0000 mL | ORAL_SOLUTION | Freq: Every evening | ORAL | 0 refills | Status: AC | PRN

## 2023-09-30 MED ORDER — OFLOXACIN 0.3 % OT SOLN: 10.0000 [drp] | Freq: Every day | OTIC | 0 refills | Status: AC

## 2023-09-30 MED ORDER — PREDNISONE 20 MG PO TABS: 40.0000 mg | ORAL_TABLET | Freq: Every day | ORAL | 0 refills | Status: AC

## 2023-09-30 NOTE — ED Triage Notes (Signed)
Pt tested positive for covid on Wednesday. Today she presents due to bilateral ear pain and cough.

## 2023-09-30 NOTE — Discharge Instructions (Signed)
You have an ear infection of the ear canal known as otitis externa. Use ear drops as prescribed for 7 days. Do not place anything smaller than elbow deep into ear canal- this includes Q-tips. You may place a small amount of rubbing alcohol onto the end of a Q-tip and place this into the outer ear canal to dry up any remaining water that may have gotten into the ear while showering or submerging head underwater to prevent this type of infection in the future.   You have a viral illness which will improve on its own with rest, fluids, and medications to help with your symptoms.  Tylenol, guaifenesin (plain mucinex), and saline nasal sprays may help relieve symptoms.  Promethazine DM syrup at bedtime as needed. Tessalon perles every 8 hours as needed for cough- non drowsy.  Two teaspoons of honey in 1 cup of warm water every 4-6 hours may help with throat pains.  Humidifier in room at nighttime may help soothe cough (clean well daily).   For chest pain, shortness of breath, inability to keep food or fluids down without vomiting, fever that does not respond to tylenol or motrin, or any other severe symptoms, please go to the ER for further evaluation. Return to urgent care as needed, otherwise follow-up with PCP.

## 2023-09-30 NOTE — ED Provider Notes (Signed)
[name]    CSN: [id number] Arrival date & time: 09/30/23  0846      History   Chief Complaint Chief Complaint  Patient presents with   Cough    Tested positive for covid - Entered by patient    HPI Danielle Cherry is a 38 y.o. female.   Danielle Cherry is a 38 y.o. female presenting for chief complaint of cough, congestion, generalized fatigue, and bilateral ear pain that started 4 days ago. Multiple recent sick contacts in the household with COVID, she took a home COVID test 2 days ago and it was positive. She had a fever on the first day of illness and reports this has since resolved. Reports ear itching and crusty ear drainage that started a few days ago as well. History of frequent/persistent otitis externa infections for which she has been evaluated by ENT. Denies chest pain, SOB, N/V/D, abdominal pain, dizziness. No history of chronic respiratory problems. Former cigarette smoker. Taking OTC medicines with some relief.    Cough   Past Medical History:  Diagnosis Date   Depression    took meds briefly, going to therapy, doing good   No pertinent past medical history     Patient Active Problem List   Diagnosis Date Noted   SVD (10/25) 06/10/2020   S/P tubal ligation 06/10/2020   Indication for care in labor or delivery 06/09/2020   Hospice care patient 07/06/2019   Depression 12/12/2017    Past Surgical History:  Procedure Laterality Date   DILATION AND EVACUATION N/A 07/06/2019   Procedure: DILATATION AND EVACUATION;  Surgeon: Jaymes Graff, MD;  Location: [facility];  Service: Gynecology;  Laterality: N/A;  Ultrasound Guidance   NO PAST SURGERIES     OPERATIVE ULTRASOUND N/A 07/06/2019   Procedure: OPERATIVE ULTRASOUND;  Surgeon: Jaymes Graff, MD;  Location:  SUR[facility]ERY [facility];  Service: Gynecology;  Laterality: N/A;   TUBAL LIGATION N/A 06/10/2020   Procedure: POST PARTUM TUBAL LIGATION;  Surgeon: Essie Hart, MD;  Location: [facility];  Service: Gynecology;  Laterality: N/A;    OB History     Gravida  6   Para  4   Term  4   Preterm      AB  2   Living  4      SAB  2   IAB      Ectopic      Multiple  0   Live Births  4        Obstetric Comments  Bled after delivery of daughter, "had to get a shot"          Home Medications    Prior to Admission medications   Medication Sig Start Date End Date Taking? Authorizing Provider  benzonatate (TESSALON) 100 MG capsule Take 1 capsule (100 mg total) by mouth every 8 (eight) hours. 09/30/23  Yes Carlisle Beers, FNP  methylphenidate 27 MG PO CR tablet Take 27 mg by mouth every morning. 09/27/23  Yes [provider]  ofloxacin (FLOXIN) 0.3 % OTIC solution Place 10 drops into both ears daily. 09/30/23  Yes Carlisle Beers, FNP  predniSONE (DELTASONE) 20 MG tablet Take 2 tablets (40 mg total) by mouth daily with breakfast for 5 days. 09/30/23 10/05/23 Yes Carlisle Beers, FNP  promethazine-dextromethorphan (PROMETHAZINE-DM) 6.25-15 MG/5ML syrup Take 5 mLs by mouth at bedtime as needed for cough. 09/30/23  Yes Carlisle Beers, FNP  amoxicillin-clavulanate (AUGMENTIN) 875-125 MG tablet Take  1 tablet by mouth every 12 (twelve) hours. Patient not taking: Reported on 09/30/2023 01/07/23   Mesner, Barbara Cower, MD  ibuprofen (ADVIL) 600 MG tablet Take 1 tablet (600 mg total) by mouth every 6 (six) hours as needed. 08/11/22   Rancour, Jeannett Senior, MD  ondansetron (ZOFRAN-ODT) 4 MG disintegrating tablet Take 1 tablet (4 mg total) by mouth every 8 (eight) hours as needed for nausea or vomiting. Patient not taking: Reported on 09/30/2023 08/11/22   Glynn Octave, MD    Family History Family History  Problem Relation Age of Onset   Diabetes Mother    Hypertension Mother    Hyperlipidemia Father    Cancer Paternal Grandfather        lung   Parkinson's disease Paternal Grandmother     Social History Social History   Tobacco Use    Smoking status: Former    Types: Cigarettes   Smokeless tobacco: Never   Tobacco comments:    quit 2009  Vaping Use   Vaping status: Never Used  Substance Use Topics   Alcohol use: Not Currently    Alcohol/week: 1.0 standard drink of alcohol    Types: 1 Glasses of wine per week    Comment: social, not with preg   Drug use: No     Allergies   Strawberry extract and Latex   Review of Systems Review of Systems  Respiratory:  Positive for cough.   Per HPI  Physical Exam Triage Vital Signs ED Triage Vitals  Encounter Vitals Group     BP 09/30/23 0903 131/89     Systolic BP Percentile --      Diastolic BP Percentile --      Pulse Rate 09/30/23 0903 92     Resp 09/30/23 0903 20     Temp 09/30/23 0903 98.1 F (36.7 C)     Temp Source 09/30/23 0903 Oral     SpO2 --      Weight --      Height --      Head Circumference --      Peak Flow --      Pain Score 09/30/23 0905 4     Pain Loc --      Pain Education --      Exclude from Growth Chart --    No data found.  Updated Vital Signs BP 131/89 (BP Location: Right Arm)   Pulse 92   Temp 98.1 F (36.7 C) (Oral)   Resp 20   LMP 08/30/2023   Visual Acuity Right Eye Distance:   Left Eye Distance:   Bilateral Distance:    Right Eye Near:   Left Eye Near:    Bilateral Near:     Physical Exam Vitals and nursing note reviewed.  Constitutional:      Appearance: She is not ill-appearing or toxic-appearing.  HENT:     Head: Normocephalic and atraumatic.     Right Ear: Hearing, tympanic membrane, ear canal and external ear normal. Drainage (thick and crusty purulent drainage) and swelling present.     Left Ear: Hearing, tympanic membrane, ear canal and external ear normal. Drainage (thick and crusty purulent drainage) and swelling present.     Nose: Nose normal.     Mouth/Throat:     Lips: Pink.     Mouth: Mucous membranes are moist. No injury or oral lesions.     Dentition: Normal dentition.     Tongue: No  lesions.     Pharynx: Oropharynx is clear.  Uvula midline. No pharyngeal swelling, oropharyngeal exudate, posterior oropharyngeal erythema, uvula swelling or postnasal drip.     Tonsils: No tonsillar exudate.  Eyes:     General: Lids are normal. Vision grossly intact. Gaze aligned appropriately.     Extraocular Movements: Extraocular movements intact.     Conjunctiva/sclera: Conjunctivae normal.  Neck:     Trachea: Trachea and phonation normal.  Cardiovascular:     Rate and Rhythm: Normal rate and regular rhythm.     Heart sounds: Normal heart sounds, S1 normal and S2 normal.  Pulmonary:     Effort: Pulmonary effort is normal. No respiratory distress.     Breath sounds: Normal breath sounds and air entry.  Musculoskeletal:     Cervical back: Neck supple.  Lymphadenopathy:     Cervical: No cervical adenopathy.  Skin:    General: Skin is warm and dry.     Capillary Refill: Capillary refill takes less than 2 seconds.     Findings: No rash.  Neurological:     General: No focal deficit present.     Mental Status: She is alert and oriented to person, place, and time. Mental status is at baseline.     Cranial Nerves: No dysarthria or facial asymmetry.  Psychiatric:        Mood and Affect: Mood normal.        Speech: Speech normal.        Behavior: Behavior normal.        Thought Content: Thought content normal.        Judgment: Judgment normal.      UC Treatments / Results  Labs (all labs ordered are listed, but only abnormal results are displayed) Labs Reviewed - No data to display  EKG   Radiology No results found.  Procedures Procedures (including critical care time)  Medications Ordered in UC Medications - No data to display  Initial Impression / Assessment and Plan / UC Course  I have reviewed the triage vital signs and the nursing notes.  Pertinent labs & imaging results that were available during my care of the patient were reviewed by me and considered in my  medical decision making (see chart for details).   1. Clinical diagnosis of COVID-19, acute cough COVID testing positive at home.  Lungs clear, vitals hemodynamically stable, therefore deferred imaging of the chest.  Discussed use of antiviral, patient declines paxlovid prescription, I am agreeable with this as she is low risk for severe disease. Discussed most up to date CDC guidelines regarding quarantine and masking to prevent transmission.  Recommend supportive care for symptomatic relief as outlined in AVS.    Presentation consistent with otitis externa. Will manage this with ofloxacin otic drops as prescribed for 7 days. Oral prednisone burst ordered to reduce swelling and inflammation to the ear canals. Encouraged to avoid getting water into affected ear(s) for at least 7-10 days. Over the counter medications as needed for pain.   Counseled patient on potential for adverse effects with medications prescribed/recommended today, strict ER and return-to-clinic precautions discussed, patient verbalized understanding.    Final Clinical Impressions(s) / UC Diagnoses   Final diagnoses:  Clinical diagnosis of COVID-19  Acute cough  Infective otitis externa of both ears     Discharge Instructions      You have an ear infection of the ear canal known as otitis externa. Use ear drops as prescribed for 7 days. Do not place anything smaller than elbow deep into ear canal- this includes  Q-tips. You may place a small amount of rubbing alcohol onto the end of a Q-tip and place this into the outer ear canal to dry up any remaining water that may have gotten into the ear while showering or submerging head underwater to prevent this type of infection in the future.   You have a viral illness which will improve on its own with rest, fluids, and medications to help with your symptoms.  Tylenol, guaifenesin (plain mucinex), and saline nasal sprays may help relieve symptoms.  Promethazine DM syrup  at bedtime as needed. Tessalon perles every 8 hours as needed for cough- non drowsy.  Two teaspoons of honey in 1 cup of warm water every 4-6 hours may help with throat pains.  Humidifier in room at nighttime may help soothe cough (clean well daily).   For chest pain, shortness of breath, inability to keep food or fluids down without vomiting, fever that does not respond to tylenol or motrin, or any other severe symptoms, please go to the ER for further evaluation. Return to urgent care as needed, otherwise follow-up with PCP.      ED Prescriptions     Medication Sig Dispense Auth. Provider   ofloxacin (FLOXIN) 0.3 % OTIC solution Place 10 drops into both ears daily. 5 mL Reita May M, FNP   predniSONE (DELTASONE) 20 MG tablet Take 2 tablets (40 mg total) by mouth daily with breakfast for 5 days. 10 tablet Carlisle Beers, FNP   promethazine-dextromethorphan (PROMETHAZINE-DM) 6.25-15 MG/5ML syrup Take 5 mLs by mouth at bedtime as needed for cough. 118 mL Reita May M, FNP   benzonatate (TESSALON) 100 MG capsule Take 1 capsule (100 mg total) by mouth every 8 (eight) hours. 21 capsule Carlisle Beers, FNP      PDMP not reviewed this encounter.   Carlisle Beers, [place] 09/30/23 1011

## 2023-12-19 ENCOUNTER — Ambulatory Visit
Admission: RE | Admit: 2023-12-19 | Discharge: 2023-12-19 | Disposition: A | Discharge status: Home / Self Care | Source: Ambulatory Visit

## 2023-12-19 VITALS — BP 125/89 | HR 91 | Temp 97.9°F | Resp 16

## 2023-12-19 DIAGNOSIS — L309 Dermatitis, unspecified: Secondary | ICD-10-CM | POA: Diagnosis not present

## 2023-12-19 DIAGNOSIS — W57XXXA Bitten or stung by nonvenomous insect and other nonvenomous arthropods, initial encounter: Secondary | ICD-10-CM

## 2023-12-19 DIAGNOSIS — S20362A Insect bite (nonvenomous) of left front wall of thorax, initial encounter: Secondary | ICD-10-CM | POA: Diagnosis not present

## 2023-12-19 MED ORDER — TRIAMCINOLONE ACETONIDE 0.1 % EX CREA: 1.0000 | TOPICAL_CREAM | Freq: Two times a day (BID) | CUTANEOUS | 0 refills | Status: AC

## 2023-12-19 NOTE — ED Triage Notes (Signed)
 Pt and parent reports poison oak or ivy exposure x4 days. She has itching rash to both arms trailing up to axillary area. No med or cream use at home.   She also removed a tick to L abdomen this morning and would like site assessed if possible. Removed whole tick per pt.

## 2023-12-19 NOTE — ED Provider Notes (Signed)
 [facility]    CSN: [id number] Arrival date & time: 12/19/23  1157      History   Chief Complaint Chief Complaint  Patient presents with   Poison Ivy    Most likely poison oak but it can be poison ivy. Spreading on right arm and starting on left arm and face. - Entered by patient    HPI Danielle Cherry is a 38 y.o. female.   Patient presents today for evaluation of rash suspected to be caused by poison ivy. She reports that symptoms started 4 days ago. She reports rash is itchy. Son has similar rash. She has not tried any treatment for symptoms.  She also reports she removed a tick from her left lateral chest area this morning. Tick was not present last night. She would like area checked but does not have any swelling or erythema.   The history is provided by the patient.  Poison Ivy Pertinent negatives include no abdominal pain and no shortness of breath.    Past Medical History:  Diagnosis Date   Depression    took meds briefly, going to therapy, doing good   No pertinent past medical history     Patient Active Problem List   Diagnosis Date Noted   Injury of ear canal 04/22/2021   SVD (10/25) 06/10/2020   S/P tubal ligation 06/10/2020   Indication for care in labor or delivery 06/09/2020   Hospice care patient 07/06/2019   Depression 12/12/2017    Past Surgical History:  Procedure Laterality Date   DILATION AND EVACUATION N/A 07/06/2019   Procedure: DILATATION AND EVACUATION;  Surgeon: Marylu Soda, MD;  Location: [facility];  Service: Gynecology;  Laterality: N/A;  Ultrasound Guidance   NO PAST SURGERIES     OPERATIVE ULTRASOUND N/A 07/06/2019   Procedure: OPERATIVE ULTRASOUND;  Surgeon: Marylu Soda, MD;  Location:  SUR[facility]ERY CENTER;  Service: Gynecology;  Laterality: N/A;   TUBAL LIGATION N/A 06/10/2020   Procedure: POST PARTUM TUBAL LIGATION;  Surgeon: Artemisa Bile, MD;  Location: [facility];  Service: Gynecology;  Laterality:  N/A;    OB History     Gravida  6   Para  4   Term  4   Preterm      AB  2   Living  4      SAB  2   IAB      Ectopic      Multiple  0   Live Births  4        Obstetric Comments  Bled after delivery of daughter, "had to get a shot"          Home Medications    Prior to Admission medications   Medication Sig Start Date End Date Taking? Authorizing Provider  acetaminophen -codeine (TYLENOL  #3) 300-30 MG tablet Take 1 tablet by mouth every 4 (four) hours as needed. 07/23/23   [provider]  chlorhexidine (PERIDEX) 0.12 % solution Use as directed 15 mLs in the mouth or throat as directed. Patient not taking: Reported on 12/19/2023 07/23/23   [provider]  clindamycin (CLEOCIN) 300 MG capsule Take 300 mg by mouth 3 (three) times daily. Patient not taking: Reported on 12/19/2023 07/23/23   [provider]  desvenlafaxine (PRISTIQ) 25 MG 24 hr tablet Take 25 mg by mouth. 11/23/23  Yes [provider]  doxycycline  (VIBRA -TABS) 100 MG tablet Take 100 mg by mouth. Patient not taking: Reported on 12/19/2023 12/15/23 12/25/23  [provider]  hydrOXYzine  (ATARAX ) 25 MG tablet Take 25 mg by mouth every 8 (eight) hours as needed. Patient not taking: Reported on 12/19/2023 08/02/23   [provider]  methylphenidate 36 MG PO CR tablet Take 36 mg by mouth every morning. 11/25/23  Yes [provider]  triamcinolone cream (KENALOG) 0.1 % Apply 1 Application topically 2 (two) times daily. 12/19/23  Yes Vernestine Gondola, PA-C  amoxicillin -clavulanate (AUGMENTIN ) 875-125 MG tablet Take 1 tablet by mouth every 12 (twelve) hours. Patient not taking: Reported on 09/30/2023 01/07/23   Mesner, Reymundo Caulk, MD  benzonatate  (TESSALON ) 100 MG capsule Take 1 capsule (100 mg total) by mouth every 8 (eight) hours. Patient not taking: Reported on 12/19/2023 09/30/23   Starlene Eaton, FNP  ibuprofen  (ADVIL ) 600 MG tablet Take 1 tablet (600 mg  total) by mouth every 6 (six) hours as needed. 08/11/22   Rancour, Mara [place], MD  methylphenidate 27 MG PO CR tablet Take 27 mg by mouth every morning. Patient not taking: Reported on 12/19/2023 09/27/23   [provider]  ofloxacin  (FLOXIN ) 0.3 % OTIC solution Place 10 drops into both ears daily. Patient not taking: Reported on 12/19/2023 09/30/23   Starlene Eaton, FNP  ondansetron  (ZOFRAN -ODT) 4 MG disintegrating tablet Take 1 tablet (4 mg total) by mouth every 8 (eight) hours as needed for nausea or vomiting. Patient not taking: Reported on 09/30/2023 08/11/22   Earma Gloss, MD  promethazine -dextromethorphan (PROMETHAZINE -DM) 6.25-15 MG/5ML syrup Take 5 mLs by mouth at bedtime as needed for cough. Patient not taking: Reported on 12/19/2023 09/30/23   Starlene Eaton, FNP  Vitamin D, Ergocalciferol, (DRISDOL) 1.25 MG (50000 UNIT) CAPS capsule Take 50,000 Units by mouth once a week.   Yes [provider]    Family History Family History  Problem Relation Age of Onset   Diabetes Mother    Hypertension Mother    Hyperlipidemia Father    Cancer Paternal Grandfather        lung   Parkinson's disease Paternal Grandmother     Social History Social History   Tobacco Use   Smoking status: Former    Types: Cigarettes   Smokeless tobacco: Never   Tobacco comments:    quit 2009  Vaping Use   Vaping status: Never Used  Substance Use Topics   Alcohol use: Not Currently    Alcohol/week: 1.0 standard drink of alcohol    Types: 1 Glasses of wine per week    Comment: social, not with preg   Drug use: No     Allergies   Strawberry extract and Latex   Review of Systems Review of Systems  Constitutional:  Negative for chills and fever.  Eyes:  Negative for discharge and redness.  Respiratory:  Negative for shortness of breath.   Gastrointestinal:  Negative for abdominal pain, nausea and vomiting.  Skin:  Positive for rash.     Physical Exam Triage Vital  Signs ED Triage Vitals  Encounter Vitals Group     BP      Systolic BP Percentile      Diastolic BP Percentile      Pulse      Resp      Temp      Temp src      SpO2      Weight      Height      Head Circumference      Peak Flow      Pain Score  Pain Loc      Pain Education      Exclude from Growth Chart    No data found.  Updated Vital Signs BP 125/89 (BP Location: Left Arm)   Pulse 91   Temp 97.9 F (36.6 C) (Oral)   Resp 16   LMP 11/21/2023 (Approximate)   SpO2 96%   Breastfeeding No   Visual Acuity Right Eye Distance:   Left Eye Distance:   Bilateral Distance:    Right Eye Near:   Left Eye Near:    Bilateral Near:     Physical Exam Vitals and nursing note reviewed.  Constitutional:      General: She is not in acute distress.    Appearance: Normal appearance. She is not ill-appearing.  HENT:     Head: Normocephalic and atraumatic.  Eyes:     Conjunctiva/sclera: Conjunctivae normal.  Cardiovascular:     Rate and Rhythm: Normal rate.  Pulmonary:     Effort: Pulmonary effort is normal. No respiratory distress.  Skin:    Comments: Small areas pf Mildly erythematous papular rash noted to bilateral arms. Pinpoint wound without surrounding swelling or erythema  noted to left lateral chest.   Neurological:     Mental Status: She is alert.  Psychiatric:        Mood and Affect: Mood normal.        Behavior: Behavior normal.        Thought Content: Thought content normal.      UC Treatments / Results  Labs (all labs ordered are listed, but only abnormal results are displayed) Labs Reviewed - No data to display  EKG   Radiology No results found.  Procedures Procedures (including critical care time)  Medications Ordered in UC Medications - No data to display  Initial Impression / Assessment and Plan / UC Course  I have reviewed the triage vital signs and the nursing notes.  Pertinent labs & imaging results that were available during my  care of the patient were reviewed by me and considered in my medical decision making (see chart for details).    Discussed that location of tick bite appears without infection, non-concerning at this time but recommended she continue to monitor. Abx prophylaxis deferred given tick was attached less than 24 hours. Will treat rash with steroid cream and encouraged follow up if no gradual improvement or with any worsening.   Final Clinical Impressions(s) / UC Diagnoses   Final diagnoses:  Dermatitis  Tick bite of left front wall of thorax, initial encounter   Discharge Instructions   None    ED Prescriptions     Medication Sig Dispense Auth. Provider   triamcinolone cream (KENALOG) 0.1 % Apply 1 Application topically 2 (two) times daily. 30 g Vernestine Gondola, PA-C      PDMP not reviewed this encounter.   Vernestine Gondola, PA-C 12/20/23 1447

## 2024-05-25 ENCOUNTER — Encounter (HOSPITAL_COMMUNITY): Payer: Self-pay

## 2024-05-25 ENCOUNTER — Other Ambulatory Visit: Payer: Self-pay

## 2024-05-25 ENCOUNTER — Emergency Department (HOSPITAL_COMMUNITY)
Admission: EM | Admit: 2024-05-25 | Discharge: 2024-05-25 | Disposition: A | Discharge status: Home / Self Care | Attending: Pediatric Emergency Medicine | Admitting: Pediatric Emergency Medicine

## 2024-05-25 DIAGNOSIS — H9201 Otalgia, right ear: Secondary | ICD-10-CM | POA: Diagnosis present

## 2024-05-25 DIAGNOSIS — H60311 Diffuse otitis externa, right ear: Secondary | ICD-10-CM | POA: Insufficient documentation

## 2024-05-25 DIAGNOSIS — Z91040 Latex allergy status: Secondary | ICD-10-CM | POA: Insufficient documentation

## 2024-05-25 MED ORDER — CIPROFLOXACIN-DEXAMETHASONE 0.3-0.1 % OT SUSP: 4.0000 [drp] | Freq: Two times a day (BID) | OTIC | 0 refills | Status: DC

## 2024-05-25 MED ORDER — DEXAMETHASONE SOD PHOSPHATE PF 10 MG/ML IJ SOLN
10.0000 mg | Freq: Once | INTRAMUSCULAR | Status: AC
  Administered 2024-05-25: 10 mg via INTRAMUSCULAR

## 2024-05-25 MED ORDER — AMOXICILLIN-POT CLAVULANATE 875-125 MG PO TABS: 1.0000 | ORAL_TABLET | Freq: Two times a day (BID) | ORAL | 0 refills | Status: DC

## 2024-05-25 MED ORDER — CIPROFLOXACIN-DEXAMETHASONE 0.3-0.1 % OT SUSP: 4.0000 [drp] | Freq: Two times a day (BID) | OTIC | 0 refills | Status: AC

## 2024-05-25 MED ORDER — OXYCODONE-ACETAMINOPHEN 5-325 MG PO TABS
1.0000 | ORAL_TABLET | Freq: Once | ORAL | Status: AC
  Administered 2024-05-25: 1 via ORAL
  Filled 2024-05-25: qty 1

## 2024-05-25 MED ORDER — AMOXICILLIN-POT CLAVULANATE 875-125 MG PO TABS: 1.0000 | ORAL_TABLET | Freq: Two times a day (BID) | ORAL | 0 refills | Status: AC

## 2024-05-25 NOTE — ED Notes (Signed)
 PT REPORTS FEELING THE RIGHT SIDE OF HER NECK IS SWOLLEN AND HAVING TROUBLE SWALLOWING. REPORTS NO SOB.

## 2024-05-25 NOTE — ED Triage Notes (Signed)
 Pt c/o R inner ear pain since yesterday; denies fevers, denies cough; states she has a hx of swimmers ear  Pt gives verbal consent for mse

## 2024-05-25 NOTE — ED Provider Notes (Signed)
 [place] EMERGENCY DEPARTMENT AT [facility] Provider Note   CSN: [id number] Arrival date & time: 05/25/24  9294     Patient presents with: No chief complaint on file.   Danielle Cherry is a 38 y.o. female right ear pain for the last 24 hours.  Prior history of otitis externa.  Sore throat noted as well.  No vomiting or diarrhea.  No fevers.  No medicines prior to arrival.   HPI     Prior to Admission medications   Medication Sig Start Date End Date Taking? Authorizing Provider  acetaminophen -codeine (TYLENOL  #3) 300-30 MG tablet Take 1 tablet by mouth every 4 (four) hours as needed. 07/23/23   [provider]  amoxicillin -clavulanate (AUGMENTIN ) 875-125 MG tablet Take 1 tablet by mouth every 12 (twelve) hours. 05/25/24   Tyjai Matuszak, Bernardino PARAS, MD  benzonatate  (TESSALON ) 100 MG capsule Take 1 capsule (100 mg total) by mouth every 8 (eight) hours. Patient not taking: Reported on 12/19/2023 09/30/23   Enedelia Dorna HERO, FNP  chlorhexidine (PERIDEX) 0.12 % solution Use as directed 15 mLs in the mouth or throat as directed. Patient not taking: Reported on 12/19/2023 07/23/23   [provider]  ciprofloxacin-dexamethasone  (CIPRODEX) OTIC suspension Place 4 drops into both ears 2 (two) times daily for 5 days. 05/25/24 05/30/24  Donzetta Bernardino PARAS, MD  clindamycin (CLEOCIN) 300 MG capsule Take 300 mg by mouth 3 (three) times daily. Patient not taking: Reported on 12/19/2023 07/23/23   [provider]  desvenlafaxine (PRISTIQ) 25 MG 24 hr tablet Take 25 mg by mouth. 11/23/23   [provider]  hydrOXYzine  (ATARAX ) 25 MG tablet Take 25 mg by mouth every 8 (eight) hours as needed. Patient not taking: Reported on 12/19/2023 08/02/23   [provider]  ibuprofen  (ADVIL ) 600 MG tablet Take 1 tablet (600 mg total) by mouth every 6 (six) hours as needed. 08/11/22   Rancour, Garnette, MD  methylphenidate 27 MG PO CR tablet Take 27 mg by mouth every  morning. Patient not taking: Reported on 12/19/2023 09/27/23   [provider]  methylphenidate 36 MG PO CR tablet Take 36 mg by mouth every morning. 11/25/23   [provider]  ofloxacin  (FLOXIN ) 0.3 % OTIC solution Place 10 drops into both ears daily. Patient not taking: Reported on 12/19/2023 09/30/23   Enedelia Dorna HERO, FNP  ondansetron  (ZOFRAN -ODT) 4 MG disintegrating tablet Take 1 tablet (4 mg total) by mouth every 8 (eight) hours as needed for nausea or vomiting. Patient not taking: Reported on 09/30/2023 08/11/22   Carita Garnette, MD  promethazine -dextromethorphan (PROMETHAZINE -DM) 6.25-15 MG/5ML syrup Take 5 mLs by mouth at bedtime as needed for cough. Patient not taking: Reported on 12/19/2023 09/30/23   Enedelia Dorna HERO, FNP  triamcinolone  cream (KENALOG ) 0.1 % Apply 1 Application topically 2 (two) times daily. 12/19/23   Billy Asberry FALCON, PA-C  Vitamin D, Ergocalciferol, (DRISDOL) 1.25 MG (50000 UNIT) CAPS capsule Take 50,000 Units by mouth once a week.    [provider]    Allergies: Strawberry extract and Latex    Review of Systems  All other systems reviewed and are negative.   Updated Vital Signs BP (!) 150/95 (BP Location: Left Arm)   Pulse 86   Temp 99.2 F (37.3 C)   Resp 15   SpO2 100%   Physical Exam Vitals and nursing note reviewed.  Constitutional:      General: She is not in acute distress.    Appearance: She is  not ill-appearing.  HENT:     Left Ear: Tympanic membrane normal.     Ears:     Comments: Purulent debris throughout right ear canal swollen limiting visualization of tympanic membrane that appears normal pain with pinna traction without mastoid erythema or extending tenderness    Mouth/Throat:     Mouth: Mucous membranes are moist.  Cardiovascular:     Rate and Rhythm: Normal rate.     Pulses: Normal pulses.  Pulmonary:     Effort: Pulmonary effort is normal. No respiratory distress.     Breath sounds: No wheezing,  rhonchi or rales.  Abdominal:     Tenderness: There is no abdominal tenderness.  Musculoskeletal:     Cervical back: No rigidity.  Lymphadenopathy:     Cervical: Cervical adenopathy present.  Skin:    General: Skin is warm.     Capillary Refill: Capillary refill takes less than 2 seconds.  Neurological:     General: No focal deficit present.     Mental Status: She is alert.  Psychiatric:        Behavior: Behavior normal.     (all labs ordered are listed, but only abnormal results are displayed) Labs Reviewed - No data to display  EKG: None  Radiology: No results found.   Procedures   Medications Ordered in the ED  dexamethasone  (DECADRON ) injection 10 mg (10 mg Intramuscular Given 05/25/24 1247)  oxyCODONE -acetaminophen  (PERCOCET/ROXICET) 5-325 MG per tablet 1 tablet (1 tablet Oral Given 05/25/24 1247)                                    Medical Decision Making Amount and/or Complexity of Data Reviewed Independent Historian: parent External Data Reviewed: notes.  Risk Prescription drug management.   38 year old female here with right ear pain progressive in severity with history of acute otitis externa.  On exam here today suspect a similar presentation.  Bulging erythematous canal with purulent debris occluding majority of tympanic membrane on the right normal canal and tympanic membrane on left.  Pain with pinna traction and associated cervical lymphadenopathy.  No posterior pharynx asymmetry.  Able to flex and extend neck.  Doubt deep neck infection at this time.  Patient to benefit from antibiotic therapy and on chart review has benefited from topical and oral antibiotics in the past.  Single dose of steroids here as well as single dose of narcotic pain medication.  Patient tolerated these interventions.  Provided scripts for home-going.  Plan for ENT follow-up.  Return precautions provided to patient and she was discharged.     Final diagnoses:  Acute diffuse  otitis externa of right ear    ED Discharge Orders          Ordered    amoxicillin -clavulanate (AUGMENTIN ) 875-125 MG tablet  Every 12 hours,   Status:  Discontinued        05/25/24 1239    ciprofloxacin-dexamethasone  (CIPRODEX) OTIC suspension  2 times daily,   Status:  Discontinued        05/25/24 1239    amoxicillin -clavulanate (AUGMENTIN ) 875-125 MG tablet  Every 12 hours        05/25/24 1251    ciprofloxacin-dexamethasone  (CIPRODEX) OTIC suspension  2 times daily        05/25/24 1251               Sabastien Tyler, Bernardino PARAS, MD 05/25/24 1308
